# Patient Record
Sex: Male | Born: 1985 | Race: White | Hispanic: No | Marital: Single | Smoking: Never smoker
Health system: Southern US, Community
[De-identification: ages and names within clinical notes are randomized; demographics above are authoritative.]

---
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
## 2005-03-04 ENCOUNTER — Emergency Department (HOSPITAL_COMMUNITY): Admission: EM | Admit: 2005-03-04 | Discharge: 2005-03-04 | Payer: Self-pay | Admitting: Emergency Medicine

## 2005-04-29 ENCOUNTER — Emergency Department (HOSPITAL_COMMUNITY): Admission: EM | Admit: 2005-04-29 | Discharge: 2005-04-30 | Payer: Self-pay | Admitting: Emergency Medicine

## 2011-11-28 DIAGNOSIS — IMO0002 Reserved for concepts with insufficient information to code with codable children: Secondary | ICD-10-CM | POA: Diagnosis not present

## 2014-03-17 DIAGNOSIS — M109 Gout, unspecified: Secondary | ICD-10-CM | POA: Diagnosis not present

## 2014-06-06 DIAGNOSIS — Z6825 Body mass index (BMI) 25.0-25.9, adult: Secondary | ICD-10-CM | POA: Diagnosis not present

## 2014-06-06 DIAGNOSIS — H9193 Unspecified hearing loss, bilateral: Secondary | ICD-10-CM | POA: Diagnosis not present

## 2014-07-08 DIAGNOSIS — H903 Sensorineural hearing loss, bilateral: Secondary | ICD-10-CM | POA: Diagnosis not present

## 2014-07-08 DIAGNOSIS — H9313 Tinnitus, bilateral: Secondary | ICD-10-CM | POA: Diagnosis not present

## 2014-09-04 DIAGNOSIS — J329 Chronic sinusitis, unspecified: Secondary | ICD-10-CM | POA: Diagnosis not present

## 2014-09-04 DIAGNOSIS — Z6825 Body mass index (BMI) 25.0-25.9, adult: Secondary | ICD-10-CM | POA: Diagnosis not present

## 2016-02-27 DIAGNOSIS — N50819 Testicular pain, unspecified: Secondary | ICD-10-CM | POA: Diagnosis not present

## 2016-02-27 DIAGNOSIS — R1084 Generalized abdominal pain: Secondary | ICD-10-CM | POA: Diagnosis not present

## 2016-02-27 DIAGNOSIS — R109 Unspecified abdominal pain: Secondary | ICD-10-CM | POA: Diagnosis not present

## 2016-02-27 DIAGNOSIS — N50812 Left testicular pain: Secondary | ICD-10-CM | POA: Diagnosis not present

## 2016-02-27 DIAGNOSIS — R911 Solitary pulmonary nodule: Secondary | ICD-10-CM | POA: Diagnosis not present

## 2016-02-29 ENCOUNTER — Emergency Department (HOSPITAL_COMMUNITY): Payer: Medicare Other

## 2016-02-29 ENCOUNTER — Encounter (HOSPITAL_COMMUNITY): Payer: Self-pay

## 2016-02-29 ENCOUNTER — Emergency Department (HOSPITAL_COMMUNITY)
Admission: EM | Admit: 2016-02-29 | Discharge: 2016-02-29 | Disposition: A | Discharge status: Home / Self Care | Payer: Medicare Other | Attending: Emergency Medicine | Admitting: Emergency Medicine

## 2016-02-29 DIAGNOSIS — R1084 Generalized abdominal pain: Secondary | ICD-10-CM

## 2016-02-29 DIAGNOSIS — R1032 Left lower quadrant pain: Secondary | ICD-10-CM

## 2016-02-29 DIAGNOSIS — R1011 Right upper quadrant pain: Secondary | ICD-10-CM | POA: Diagnosis not present

## 2016-02-29 DIAGNOSIS — K760 Fatty (change of) liver, not elsewhere classified: Secondary | ICD-10-CM | POA: Insufficient documentation

## 2016-02-29 LAB — COMPREHENSIVE METABOLIC PANEL
ALT: 96 U/L — AB (ref 17–63)
AST: 46 U/L — ABNORMAL HIGH (ref 15–41)
Albumin: 4.5 g/dL (ref 3.5–5.0)
Alkaline Phosphatase: 55 U/L (ref 38–126)
Anion gap: 9 (ref 5–15)
BUN: 13 mg/dL (ref 6–20)
CHLORIDE: 104 mmol/L (ref 101–111)
CO2: 27 mmol/L (ref 22–32)
CREATININE: 1.04 mg/dL (ref 0.61–1.24)
Calcium: 9.8 mg/dL (ref 8.9–10.3)
GFR calc non Af Amer: >60 mL/min (ref >60)
Glucose, Bld: 93 mg/dL (ref 65–99)
Potassium: 4.2 mmol/L (ref 3.5–5.1)
SODIUM: 140 mmol/L (ref 135–145)
Total Bilirubin: 1 mg/dL (ref 0.3–1.2)
Total Protein: 7 g/dL (ref 6.5–8.1)

## 2016-02-29 LAB — CBC
HEMATOCRIT: 47.5 % (ref 39.0–52.0)
HEMOGLOBIN: 16.9 g/dL (ref 13.0–17.0)
MCH: 31.8 pg (ref 26.0–34.0)
MCHC: 35.6 g/dL (ref 30.0–36.0)
MCV: 89.5 fL (ref 78.0–100.0)
Platelets: 215 10*3/uL (ref 150–400)
RBC: 5.31 MIL/uL (ref 4.22–5.81)
RDW: 12.5 % (ref 11.5–15.5)
WBC: 6.6 10*3/uL (ref 4.0–10.5)

## 2016-02-29 LAB — URINALYSIS, ROUTINE W REFLEX MICROSCOPIC
Bilirubin Urine: NEGATIVE
GLUCOSE, UA: NEGATIVE mg/dL
HGB URINE DIPSTICK: NEGATIVE
Ketones, ur: NEGATIVE mg/dL
Leukocytes, UA: NEGATIVE
Nitrite: NEGATIVE
PH: 6 (ref 5.0–8.0)
Protein, ur: NEGATIVE mg/dL
SPECIFIC GRAVITY, URINE: 1.023 (ref 1.005–1.030)

## 2016-02-29 LAB — LIPASE, BLOOD: LIPASE: 19 U/L (ref 11–51)

## 2016-02-29 MED ORDER — IOPAMIDOL (ISOVUE-300) INJECTION 61%
INTRAVENOUS | Status: AC
  Administered 2016-02-29: 100 mL
  Filled 2016-02-29: qty 100

## 2016-02-29 NOTE — ED Provider Notes (Signed)
CSN: [id number]     Arrival date & time 02/29/16  4098 History   First MD Initiated Contact with Patient 02/29/16 1022     Chief Complaint  Patient presents with  . 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 Patient reports his generalized pain as a achy burning sensation. Patient states he has a pressure in his left lower quadrant with sharp pain when he moves. Patient states he has some heaviness in his testicles associated with his sharp pain comes. Patient had a negative testicular ultrasound at [place] on Saturday. Patient also has associated left-sided rib soreness. Patient has been having decreased amount of bowel movements. Patient normally has 2-3 bowel movements daily. Patient states he did not have any bowel movement on Saturday. Patient took a laxative and was able to have bowel movement. Patient was seen at [facility] on Saturday where he had a CT scan. He was told his appendix was on the left and that his "gut was twisted." Patient states that while he was there he had more periumbilical pain, but it has now moved more towards his left side. Patient was given Bentyl, Tylenol, Zofran but he has not taken any medications because he does not like taking medications. Patient reports nausea but no vomiting. He also denies any fevers, chest pain, shortness of breath, dysuria.  Patient is a 30 y.o. male presenting with abdominal pain. The history is provided by the patient and medical records.  Abdominal Pain Associated symptoms: constipation and nausea   Associated symptoms: no chest pain, no chills, no diarrhea, no dysuria, no fever, no shortness of breath, no sore throat and no vomiting     History reviewed. No  pertinent past medical history. History reviewed. No pertinent past surgical history. No family history on file. Social History  Substance Use Topics  . Smoking status: Never Smoker   . Smokeless tobacco: None  . Alcohol Use: None    Review of Systems  Constitutional: Negative for fever and chills.  HENT: Negative for facial swelling and sore throat.   Respiratory: Negative for shortness of breath.   Cardiovascular: Negative for chest pain.  Gastrointestinal: Positive for nausea, abdominal pain and constipation. Negative for vomiting and diarrhea.  Genitourinary: Negative for dysuria.  Musculoskeletal: Negative for back pain.  Skin: Negative for rash and wound.  Neurological: Negative for headaches.  Psychiatric/Behavioral: The patient is not nervous/anxious.       Allergies  Review of patient's allergies indicates no known allergies.  Home Medications   Prior to Admission medications   Not on File   BP 127/100 mmHg  Pulse 78  Temp(Src) 97.9 F (36.6 C) (Oral)  Resp 16  SpO2 100% Physical Exam  Constitutional: He appears well-developed and well-nourished. No distress.  HENT:  Head: Normocephalic and atraumatic.  Mouth/Throat: Oropharynx is clear and moist. No oropharyngeal exudate.  Eyes: Conjunctivae are normal. Pupils are equal, round, and reactive to light. Right eye exhibits no discharge. Left eye exhibits no discharge. No scleral icterus.  Neck: Normal range of motion. Neck supple. No thyromegaly present.  Cardiovascular: Normal rate, regular rhythm, normal heart sounds and intact distal pulses.  Exam reveals no gallop and no friction rub.   No murmur heard. Pulmonary/Chest: Effort normal and breath sounds normal. No stridor. No respiratory  distress. He has no wheezes. He has no rales.  Abdominal: Soft. Bowel sounds are normal. He exhibits no distension, no abdominal bruit, no pulsatile midline mass and no mass. There is tenderness in the right upper quadrant and  left lower quadrant. There is positive Murphy's sign. There is no rigidity, no rebound and no guarding.  Musculoskeletal: He exhibits no edema.  Lymphadenopathy:    He has no cervical adenopathy.  Neurological: He is alert. Coordination normal.  Skin: Skin is warm and dry. No rash noted. He is not diaphoretic. No pallor.  Psychiatric: He has a normal mood and affect.  Nursing note and vitals reviewed.   ED Course  Procedures (including critical care time) Labs Review Labs Reviewed  COMPREHENSIVE METABOLIC PANEL - Abnormal; Notable for the following:    AST 46 (*)    ALT 96 (*)    All other components within normal limits  LIPASE, BLOOD  CBC  URINALYSIS, ROUTINE W REFLEX MICROSCOPIC (NOT AT [facility])    Imaging Review Ct Abdomen Pelvis W Contrast  02/29/2016  CLINICAL DATA:  Generalized abdominal pain and left flank pain. EXAM: CT ABDOMEN AND PELVIS WITH CONTRAST TECHNIQUE: Multidetector CT imaging of the abdomen and pelvis was performed using the standard protocol following bolus administration of intravenous contrast. CONTRAST:  1 ISOVUE-300 IOPAMIDOL (ISOVUE-300) INJECTION 61% COMPARISON:  CT scan of the abdomen dated 02/25/2016 [facility] FINDINGS: Lower chest: 4 mm nodule in the right lower lobe seen on image 9 of series 3, unchanged since the prior exam. The heart size is normal. Hepatobiliary: Diffuse hepatic steatosis. No focal lesions. Biliary tree is normal. Pancreas: Normal. Spleen: Normal. Adrenals/Urinary Tract: Normal. Stomach/Bowel: No evidence of obstruction. The cecum lies in the left upper quadrant and the appendix is in the left mid abdomen. There is intense enhancement of the mucosa of the terminal ileum there is no adjacent inflammation. The bowel otherwise appears normal. Vascular/Lymphatic: Normal. Reproductive: Normal. Other: No free air or free fluid. Musculoskeletal:  Normal. IMPRESSION: 1. 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 The appendix is in the left side of the abdomen. 2. Intense enhancement of the mucosa of the terminal ileum without adjacent inflammation. I suspect this is normal. Does the patient have any symptoms of inflammatory bowel disease? 3. Stable 4 mm nodule at the right lung base. No follow-up is recommended if the patient is at low risk for lung cancer. 4. Diffuse hepatic steatosis. Electronically Signed   By: Francene BoyersJames  Maxwell M.D.   On: 02/29/2016 12:21   [place] Abdomen Limited Ruq  02/29/2016  CLINICAL DATA:  Right upper quadrant abdominal pain. EXAM: US ABDOMEN LIMITED - RIGHT UPPER QUADRANT COMPARISON:  Abdomen CT obtained earlier today. FINDINGS: Gallbladder: No gallstones or wall thickening visualized. No sonographic Murphy sign noted by sonographer. Common bile duct: Diameter: 3.2 mm Liver: Diffusely echogenic.  Corresponding low density on the recent CT. IMPRESSION: 1. No acute abnormality. 2. Diffuse hepatic steatosis. Electronically Signed   By: Beckie SaltsSteven  Reid M.D.   On: 02/29/2016 13:44   I have personally reviewed and evaluated these images and lab results as part of my medical decision-making.   EKG Interpretation None      MDM   CBC unremarkable. CMP shows AST 46, ALT 96. Lipase 19. UA negative. 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 I'll discharge patient home with advice to take the Bentyl, Zofran, Tylenol that was prescribed to him by [facility] with follow-up to gastroenterology. Strict return precautions discussed. Patient also evaluated by Dr. Madilyn Hook who is in agreement with plan. Patient vitals  stable throughout ED course and discharged in satisfactory condition.  Final diagnoses:  RUQ pain  Generalized abdominal pain  LLQ pain  Fatty liver        Emi Holes, PA-C 02/29/16 1534  Tilden Fossa, MD 03/02/16 [phone]

## 2016-02-29 NOTE — Discharge Instructions (Signed)
Your CT scan and ultrasound showed that you have a fatty liver. This can be genetic. Take Bentyl, Zofran, Tylenol as prescribed for your pain and nausea. Try to stick with bland foods at this time, avoid alcohol, and drink plenty of water. Please follow-up with [facility] for further evaluation and treatment of your symptoms. Please return to the emergency department if you develop any new or worsening symptoms.   Abdominal Pain, Adult Many things can cause abdominal pain. Usually, abdominal pain is not caused by a disease and will improve without treatment. It can often be observed and treated at home. Your health care provider will do a physical exam and possibly order blood tests and X-rays to help determine the seriousness of your pain. However, in many cases, more time must pass before a clear cause of the pain can be found. Before that point, your health care provider may not know if you need more testing or further treatment. HOME CARE INSTRUCTIONS Monitor your abdominal pain for any changes. The following actions may help to alleviate any discomfort you are experiencing:  Only take over-the-counter or prescription medicines as directed by your health care provider.  Do not take laxatives unless directed to do so by your health care provider.  Try a clear liquid diet (broth, tea, or water) as directed by your health care provider. Slowly move to a bland diet as tolerated. SEEK MEDICAL CARE IF:  You have unexplained abdominal pain.  You have abdominal pain associated with nausea or diarrhea.  You have pain when you urinate or have a bowel movement.  You experience abdominal pain that wakes you in the night.  You have abdominal pain that is worsened or improved by eating food.  You have abdominal pain that is worsened with eating fatty foods.  You have a fever. SEEK IMMEDIATE MEDICAL CARE IF:  Your pain does not go away within 2 hours.  You keep throwing up  (vomiting).  Your pain is felt only in portions of the abdomen, such as the right side or the left lower portion of the abdomen.  You pass bloody or black tarry stools. MAKE SURE YOU:  Understand these instructions.  Will watch your condition.  Will get help right away if you are not doing well or get worse.   This information is not intended to replace advice given to you by your health care provider. Make sure you discuss any questions you have with your health care provider.   Document Released: 05/18/2005 Document Revised: 04/29/2015 Document Reviewed: 04/17/2013 Elsevier Interactive Patient Education 2016 Else[employer] Inc.  Fatty Liver Fatty liver, also called hepatic steatosis or steatohepatitis, is a condition in which too much fat has built up in your liver cells. The liver removes harmful substances from your bloodstream. It produces fluids your body needs. It also helps your body use and store energy from the food you eat. In many cases, fatty liver does not cause symptoms or problems. It is often diagnosed when tests are being done for other reasons. However, over time, fatty liver can cause inflammation that may lead to more serious liver problems, such as scarring of the liver (cirrhosis). CAUSES  Causes of fatty liver may include:   Drinking too much alcohol.  Poor nutrition.  Obesity.  Cushing syndrome.  Diabetes.  Hyperlipidemia.  Pregnancy.  Certain drugs.  Poisons.  Some viral infections. RISK FACTORS You may be more likely to develop fatty liver if you:  Abuse alcohol.  Are pregnant.  Are overweight.  Have diabetes.  Have hepatitis.  Have a high triglyceride level.  SIGNS AND SYMPTOMS  Fatty liver often does not cause any symptoms. In cases where symptoms develop, they can include:  Fatigue.  Weakness.  Weight loss.  Confusion.   Abdominal pain.  Yellowing of your skin and the white parts of your eyes (jaundice).  Nausea and  vomiting. DIAGNOSIS  Fatty liver may be diagnosed by:   Physical exam and medical history.  Blood tests.  Imaging tests, such as an ultrasound, CT scan, or MRI.  Liver biopsy. A small sample of liver tissue is removed using a needle. The sample is then looked at under a microscope. TREATMENT  Fatty liver is often caused by other health conditions. Treatment for fatty liver may involve medicines and lifestyle changes to manage conditions such as:   Alcoholism.  High cholesterol.  Diabetes.  Being overweight or obese.  HOME CARE INSTRUCTIONS  Eat a healthy diet as directed by your health care provider.  Exercise regularly. This can help you lose weight and control your cholesterol and diabetes. Talk to your health care provider about an exercise plan and which activities are best for you.  Do not drink alcohol.   Take medicines only as directed by your health care provider. SEEK MEDICAL CARE IF: You have difficulty controlling your:  Blood sugar.  Cholesterol.  Alcohol consumption. SEEK IMMEDIATE MEDICAL CARE IF:  You have abdominal pain.  You have jaundice.  You have nausea and vomiting.   This information is not intended to replace advice given to you by your health care provider. Make sure you discuss any questions you have with your health care provider.   Document Released: 09/23/2005 Document Revised: 08/29/2014 Document Reviewed: 12/18/2013 Elsevier Interactive Patient Education [employer]

## 2016-02-29 NOTE — ED Notes (Signed)
Patient here with generalized abdominal pain with radiation to left flank. Seen at [place] and had multiple test and MRI and no diagnosis. NAD

## 2016-03-02 DIAGNOSIS — N5089 Other specified disorders of the male genital organs: Secondary | ICD-10-CM | POA: Diagnosis present

## 2016-03-02 DIAGNOSIS — R740 Nonspecific elevation of levels of transaminase and lactic acid dehydrogenase [LDH]: Secondary | ICD-10-CM | POA: Insufficient documentation

## 2016-03-02 DIAGNOSIS — R109 Unspecified abdominal pain: Secondary | ICD-10-CM | POA: Diagnosis not present

## 2016-03-02 DIAGNOSIS — N50812 Left testicular pain: Secondary | ICD-10-CM | POA: Diagnosis not present

## 2016-03-02 DIAGNOSIS — I861 Scrotal varices: Secondary | ICD-10-CM | POA: Insufficient documentation

## 2016-03-02 LAB — URINALYSIS, ROUTINE W REFLEX MICROSCOPIC
BILIRUBIN URINE: NEGATIVE
Glucose, UA: NEGATIVE mg/dL
Hgb urine dipstick: NEGATIVE
KETONES UR: NEGATIVE mg/dL
Leukocytes, UA: NEGATIVE
NITRITE: NEGATIVE
PH: 6.5 (ref 5.0–8.0)
PROTEIN: NEGATIVE mg/dL
Specific Gravity, Urine: 1.025 (ref 1.005–1.030)

## 2016-03-02 LAB — COMPREHENSIVE METABOLIC PANEL
ALBUMIN: 4.6 g/dL (ref 3.5–5.0)
ALK PHOS: 56 U/L (ref 38–126)
ALT: 88 U/L — ABNORMAL HIGH (ref 17–63)
AST: 39 U/L (ref 15–41)
Anion gap: 8 (ref 5–15)
BILIRUBIN TOTAL: 0.6 mg/dL (ref 0.3–1.2)
BUN: 10 mg/dL (ref 6–20)
CALCIUM: 9.9 mg/dL (ref 8.9–10.3)
CO2: 28 mmol/L (ref 22–32)
Chloride: 101 mmol/L (ref 101–111)
Creatinine, Ser: 1.08 mg/dL (ref 0.61–1.24)
GFR calc Af Amer: >60 mL/min (ref >60)
GFR calc non Af Amer: >60 mL/min (ref >60)
GLUCOSE: 100 mg/dL — AB (ref 65–99)
POTASSIUM: 4.2 mmol/L (ref 3.5–5.1)
SODIUM: 137 mmol/L (ref 135–145)
TOTAL PROTEIN: 7.1 g/dL (ref 6.5–8.1)

## 2016-03-02 LAB — CBC
HEMATOCRIT: 47.8 % (ref 39.0–52.0)
Hemoglobin: 17 g/dL (ref 13.0–17.0)
MCH: 31.7 pg (ref 26.0–34.0)
MCHC: 35.6 g/dL (ref 30.0–36.0)
MCV: 89 fL (ref 78.0–100.0)
Platelets: 234 10*3/uL (ref 150–400)
RBC: 5.37 MIL/uL (ref 4.22–5.81)
RDW: 12.1 % (ref 11.5–15.5)
WBC: 9.5 10*3/uL (ref 4.0–10.5)

## 2016-03-02 LAB — LIPASE, BLOOD: Lipase: 17 U/L (ref 11–51)

## 2016-03-02 NOTE — ED Notes (Signed)
Patient arrives with complaint of scrotal swelling. States onset of swelling was last night and today. States onset of abdominal pain was about 5 days ago. Has been to [facility] and here once for the abdominal pain with no extraordinary findings. At time of other presentations patient had testicle pain, but states it wasn't evaluated.

## 2016-03-03 ENCOUNTER — Emergency Department (HOSPITAL_COMMUNITY)
Admission: EM | Admit: 2016-03-03 | Discharge: 2016-03-03 | Disposition: A | Discharge status: Home / Self Care | Payer: Medicare Other | Attending: Emergency Medicine | Admitting: Emergency Medicine

## 2016-03-03 ENCOUNTER — Emergency Department (HOSPITAL_COMMUNITY): Payer: Medicare Other

## 2016-03-03 DIAGNOSIS — R7401 Elevation of levels of liver transaminase levels: Secondary | ICD-10-CM

## 2016-03-03 DIAGNOSIS — N50819 Testicular pain, unspecified: Secondary | ICD-10-CM

## 2016-03-03 DIAGNOSIS — N5089 Other specified disorders of the male genital organs: Secondary | ICD-10-CM

## 2016-03-03 DIAGNOSIS — R109 Unspecified abdominal pain: Secondary | ICD-10-CM

## 2016-03-03 DIAGNOSIS — I861 Scrotal varices: Secondary | ICD-10-CM

## 2016-03-03 DIAGNOSIS — R740 Nonspecific elevation of levels of transaminase and lactic acid dehydrogenase [LDH]: Secondary | ICD-10-CM

## 2016-03-03 MED ORDER — OXYCODONE-ACETAMINOPHEN 5-325 MG PO TABS
1.0000 | ORAL_TABLET | Freq: Once | ORAL | Status: AC
  Administered 2016-03-03: 1 via ORAL
  Filled 2016-03-03: qty 1

## 2016-03-03 MED ORDER — OXYCODONE-ACETAMINOPHEN 5-325 MG PO TABS: 1.0000 | ORAL_TABLET | ORAL | Status: AC | PRN

## 2016-03-03 NOTE — Discharge Instructions (Signed)
Take acetaminophen or ibuprofen as needed for less severe pain.  Your evaluation today did not show a clear cause for your pain, but did not show any evidence of any serious problems. Follow up with the gastroenterologist and urologist. Return if pain is getting worse, you start running a fever, or start vomiting.   Abdominal Pain, Adult Many things can cause abdominal pain. Usually, abdominal pain is not caused by a disease and will improve without treatment. It can often be observed and treated at home. Your health care provider will do a physical exam and possibly order blood tests and X-rays to help determine the seriousness of your pain. However, in many cases, more time must pass before a clear cause of the pain can be found. Before that point, your health care provider may not know if you need more testing or further treatment. HOME CARE INSTRUCTIONS Monitor your abdominal pain for any changes. The following actions may help to alleviate any discomfort you are experiencing:  Only take over-the-counter or prescription medicines as directed by your health care provider.  Do not take laxatives unless directed to do so by your health care provider.  Try a clear liquid diet (broth, tea, or water) as directed by your health care provider. Slowly move to a bland diet as tolerated. SEEK MEDICAL CARE IF:  You have unexplained abdominal pain.  You have abdominal pain associated with nausea or diarrhea.  You have pain when you urinate or have a bowel movement.  You experience abdominal pain that wakes you in the night.  You have abdominal pain that is worsened or improved by eating food.  You have abdominal pain that is worsened with eating fatty foods.  You have a fever. SEEK IMMEDIATE MEDICAL CARE IF:  Your pain does not go away within 2 hours.  You keep throwing up (vomiting).  Your pain is felt only in portions of the abdomen, such as the right side or the left lower portion of  the abdomen.  You pass bloody or black tarry stools. MAKE SURE YOU:  Understand these instructions.  Will watch your condition.  Will get help right away if you are not doing well or get worse.   This information is not intended to replace advice given to you by your health care provider. Make sure you discuss any questions you have with your health care provider.   Document Released: 05/18/2005 Document Revised: 04/29/2015 Document Reviewed: 04/17/2013 Elsevier Interactive Patient Education 2016 [employer].  Varicocele A varicocele is a swelling of veins in the scrotum. The scrotum is the sac that contains the testicles. Varicoceles can occur on either side of the scrotum, but they are more common on the left side. They occur most often in teenage boys and young men. In most cases, varicoceles are not a serious problem. They are usually small and painless and do not require treatment. Tests may be done to confirm the diagnosis. Treatment may be needed if:  A varicocele is large, causes a lot of pain, or causes pain when exercising.  Varicoceles are found on both sides of the scrotum.  The testicle on the opposite side is absent or not normal.  A varicocele causes a decrease in the size of the testicle in a growing adolescent.  The person has fertility problems. CAUSES This condition is the result of valves in the veins not working properly. Valves in the veins help to return blood from the scrotum and testicles to the heart. If these  valves do not work well, blood flows backward and backs up into the veins, which causes the veins to swell. This is similar to what happens when varicose veins form in the leg. SYMPTOMS Most varicoceles do not cause any symptoms. If symptoms do occur, they may include:  Swelling on one side of the scrotum. The swelling may be more obvious when you are standing up.  A lumpy feeling in the scrotum.  A heavy feeling on one side of the scrotum.  A  dull ache in the scrotum, especially after exercise or prolonged standing or sitting.  Slower growth or reduced size of the testicle on the side of the varicocele (in young males).  Problems with fertility. These can occur if the testicle does not grow normally. DIAGNOSIS This condition may be diagnosed with a physical exam. You may also have an imaging test, called an ultrasound, to confirm the diagnosis and to help rule out other causes of the swelling. TREATMENT Treatment is usually not needed for this condition. If you have any pain, your health care provider may prescribe or recommend medicine to help relieve it. You may need regular exams so your health care provider can monitor the varicocele to ensure that it does not cause problems. When further treatment is needed, it may involve one of these options:  Varicocelectomy. This is a surgery in which the swollen veins are tied off so that the flow of blood goes to other veins instead.  Embolization. In this procedure, a small tube (catheter) is used to place metal coils or other blocking items in the veins. This cuts off the blood flow to the swollen veins. HOME CARE INSTRUCTIONS  Take medicines only as directed by your health care provider.  Wear supportive underwear.  Use an athletic supporter for sports.  Keep all follow-up visits as directed by your health care provider. This is important. SEEK MEDICAL CARE IF:  Your pain is increasing.  You have redness in the affected area.  You have swelling that does not decrease when you are lying down.  One of your testicles is smaller than the other.  Your testicle becomes enlarged, swollen, or painful.   This information is not intended to replace advice given to you by your health care provider. Make sure you discuss any questions you have with your health care provider.   Document Released: 11/14/2000 Document Revised: 12/23/2014 Document Reviewed: 07/16/2014 Elsevier  Interactive Patient Education [employer]2016 Elsevier Inc.

## 2016-03-03 NOTE — ED Provider Notes (Signed)
CSN: [id number]     Arrival date & time 03/02/16  2040 History  By signing my name below, I, Jasmyn B. Alexander, attest that this documentation has been prepared under the direction and in the presence of Dione Booze, MD.  Electronically Signed: Gillis Ends. Lyn Hollingshead, ED Scribe. 03/03/2016. 1:57 AM.   Chief Complaint  Patient presents with  . Groin Swelling  . Abdominal Pain    The history is provided by the patient. No language interpreter was used.   HPI Comments: Keith Garcia is a 30 y.o. male who presents to the Emergency Department complaining of gradual onset, constant, worsening, 7/10 lower abdominal pain x 5 days and scrotal swelling x 2 days. Pt has associated nausea and back pain. Pt presented in [facility] on 02/29/16 for same complaint with no significant findings on imaging results. He states that he was instructed to return to [facility] if pain worsened. Pt states pain is relieved when lying supine. Denies any vomiting, fever, dysuria, difficulty urinating.   No past medical history on file. No past surgical history on file. No family history on file. Social History  Substance Use Topics  . Smoking status: Never Smoker   . Smokeless tobacco: Not on file  . Alcohol Use: Not on file    Review of Systems  Constitutional: Negative for fever.  Gastrointestinal: Positive for nausea and abdominal pain. Negative for vomiting.  Genitourinary: Positive for scrotal swelling. Negative for dysuria and difficulty urinating.  Musculoskeletal: Positive for back pain.   Allergies  Review of patient's allergies indicates no known allergies.  Home Medications   Prior to Admission medications   Not on File   BP 118/80 mmHg  Pulse 58  Temp(Src) 98.3 F (36.8 C) (Oral)  Resp 16  Ht 5\' 11"  (1.803 m)  Wt 204 lb (92.534 kg)  BMI 28.46 kg/m2  SpO2 99% Physical Exam  Constitutional: He is oriented to person, place, and time. He appears well-developed and well-nourished.  HENT:  Head:  Normocephalic and atraumatic.  Eyes: Conjunctivae and EOM are normal. Pupils are equal, round, and reactive to light.  Neck: Normal range of motion. Neck supple. No JVD present.  Cardiovascular: Normal rate and regular rhythm.   No murmur heard. Pulmonary/Chest: Effort normal and breath sounds normal. He has no wheezes. He has no rales. He exhibits no tenderness.  Abdominal: Soft. Bowel sounds are normal. He exhibits no distension. There is tenderness. There is no rebound and no guarding. Hernia confirmed negative in the right inguinal area and confirmed negative in the left inguinal area.  Mild suprapubic tenderness   Genitourinary: Right testis shows no mass. Left testis shows no mass. Circumcised.  Testes descended without masses No hernias No definite scrotal mass  Musculoskeletal: Normal range of motion. He exhibits no edema.  Lymphadenopathy:    He has no cervical adenopathy.  Neurological: He is alert and oriented to person, place, and time. No cranial nerve deficit. He exhibits normal muscle tone. Coordination normal.  Skin: Skin is warm and dry. No rash noted.  Psychiatric: He has a normal mood and affect. His behavior is normal. Judgment and thought content normal.  Nursing note and vitals reviewed.  ED Course  Procedures (including critical care time) DIAGNOSTIC STUDIES: Oxygen Saturation is 99% on RA, normal by my interpretation.    COORDINATION OF CARE: 1:54 AM-Discussed treatment plan which includes [place] of Scrotum and Art/Ven Flow of Abd/Pel Doppler with pt at bedside and pt agreed to plan.   Labs Review  Results for orders placed or performed during the hospital encounter of 03/03/16  Lipase, blood  Result Value Ref Range   Lipase 17 11 - 51 U/L  Comprehensive metabolic panel  Result Value Ref Range   Sodium 137 135 - 145 mmol/L   Potassium 4.2 3.5 - 5.1 mmol/L   Chloride 101 101 - 111 mmol/L   CO2 28 22 - 32 mmol/L   Glucose, Bld 100 (H) 65 - 99 mg/dL   BUN 10 6  - 20 mg/dL   Creatinine, Ser 4.09 0.61 - 1.24 mg/dL   Calcium 9.9 8.9 - 81.1 mg/dL   Total Protein 7.1 6.5 - 8.1 g/dL   Albumin 4.6 3.5 - 5.0 g/dL   AST 39 15 - 41 U/L   ALT 88 (H) 17 - 63 U/L   Alkaline Phosphatase 56 38 - 126 U/L   Total Bilirubin 0.6 0.3 - 1.2 mg/dL   GFR calc non Af Amer >60 >60 mL/min   GFR calc Af Amer >60 >60 mL/min   Anion gap 8 5 - 15  CBC  Result Value Ref Range   WBC 9.5 4.0 - 10.5 K/uL   RBC 5.37 4.22 - 5.81 MIL/uL   Hemoglobin 17.0 13.0 - 17.0 g/dL   HCT 91.4 78.2 - 95.6 %   MCV 89.0 78.0 - 100.0 fL   MCH 31.7 26.0 - 34.0 pg   MCHC 35.6 30.0 - 36.0 g/dL   RDW 21.3 08.6 - 57.8 %   Platelets 234 150 - 400 K/uL  Urinalysis, Routine w reflex microscopic  Result Value Ref Range   Color, Urine YELLOW YELLOW   APPearance CLEAR CLEAR   Specific Gravity, Urine 1.025 1.005 - 1.030   pH 6.5 5.0 - 8.0   Glucose, UA NEGATIVE NEGATIVE mg/dL   Hgb urine dipstick NEGATIVE NEGATIVE   Bilirubin Urine NEGATIVE NEGATIVE   Ketones, ur NEGATIVE NEGATIVE mg/dL   Protein, ur NEGATIVE NEGATIVE mg/dL   Nitrite NEGATIVE NEGATIVE   Leukocytes, UA NEGATIVE NEGATIVE   Imaging Review US Scrotum  03/03/2016  CLINICAL DATA:  30 year old male with testicular pain and swelling. EXAM: SCROTAL ULTRASOUND DOPPLER ULTRASOUND OF THE TESTICLES TECHNIQUE: Complete ultrasound examination of the testicles, epididymis, and other scrotal structures was performed. Color and spectral Doppler ultrasound were also utilized to evaluate blood flow to the testicles. COMPARISON:  None. FINDINGS: Right testicle Measurements: 4.6 x 2.6 x 3.3 cm. No mass or microlithiasis visualized. Left testicle Measurements: 4.5 x 2.5 x 3.1 cm. No mass or microlithiasis visualized. Right epididymis:  Normal in size and appearance. Left epididymis: Normal in size and appearance. There is a 3 mm left epididymal head cyst. Hydrocele:  None visualized. Varicocele:  Left-sided varicoceles noted. Pulsed Doppler  interrogation of both testes demonstrates normal low resistance arterial and venous waveforms bilaterally. There is mild edema of the scrotal sac. 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 Electronically Signed   By: Elgie Collard M.D.   On: 03/03/2016 02:28   [place] Art/ven Flow Abd Pelv Doppler  03/03/2016  CLINICAL DATA:  30 year old male with testicular pain and swelling. EXAM: SCROTAL ULTRASOUND DOPPLER ULTRASOUND OF THE TESTICLES TECHNIQUE: Complete ultrasound examination of the testicles, epididymis, and other scrotal structures was performed. Color and spectral Doppler ultrasound were also utilized to evaluate blood flow to the testicles. COMPARISON:  None. FINDINGS: Right testicle Measurements: 4.6 x 2.6 x 3.3 cm. No mass or microlithiasis visualized. Left testicle Measurements: 4.5 x 2.5 x 3.1 cm. No mass or  microlithiasis visualized. Right epididymis:  Normal in size and appearance. Left epididymis: Normal in size and appearance. There is a 3 mm left epididymal head cyst. Hydrocele:  None visualized. Varicocele:  Left-sided varicoceles noted. Pulsed Doppler interrogation of both testes demonstrates normal low resistance arterial and venous waveforms bilaterally. There is mild edema of the scrotal sac. 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 Electronically Signed   By: Elgie CollardArash  Radparvar M.D.   On: 03/03/2016 02:28   I have personally reviewed and evaluated these images and lab results as part of my medical decision-making.  MDM   Final diagnoses:  Testicular pain  Swelling of the testicles    Persistent abdominal pain with groin swelling. Exam shows no significant swelling but will obtain scrotal ultrasound. Old records are reviewed and he was seen in ED 2 days ago with extensive workup including CT of abdomen and pelvis and abdominal ultrasound-all which were essentially negative. No concerning findings on exam.  Labs are reassuring. Only abnormality seen is mild elevation of ALT which is not significantly changed.    I personally performed the services described in this documentation, which was scribed in my presence. The recorded information has been reviewed and is accurate.       Dione Boozeavid Darionna Banke, MD 03/03/16 [phone]

## 2016-03-07 DIAGNOSIS — N419 Inflammatory disease of prostate, unspecified: Secondary | ICD-10-CM | POA: Diagnosis not present

## 2016-03-07 DIAGNOSIS — Z1389 Encounter for screening for other disorder: Secondary | ICD-10-CM | POA: Diagnosis not present

## 2016-03-07 DIAGNOSIS — K4090 Unilateral inguinal hernia, without obstruction or gangrene, not specified as recurrent: Secondary | ICD-10-CM | POA: Diagnosis not present

## 2016-03-07 DIAGNOSIS — N50819 Testicular pain, unspecified: Secondary | ICD-10-CM | POA: Diagnosis not present

## 2016-04-26 DIAGNOSIS — R21 Rash and other nonspecific skin eruption: Secondary | ICD-10-CM | POA: Diagnosis not present

## 2016-04-26 DIAGNOSIS — Z6829 Body mass index (BMI) 29.0-29.9, adult: Secondary | ICD-10-CM | POA: Diagnosis not present

## 2016-04-26 DIAGNOSIS — W57XXXA Bitten or stung by nonvenomous insect and other nonvenomous arthropods, initial encounter: Secondary | ICD-10-CM | POA: Diagnosis not present

## 2016-10-02 IMAGING — US US ABDOMEN LIMITED
1 series · 14 of 25 positions shown · non-contrast
Comparison: Abdomen CT obtained earlier today.

CLINICAL DATA: Right upper quadrant abdominal pain.

EXAM:
US ABDOMEN LIMITED - RIGHT UPPER QUADRANT

[Series 1: us abdomen limited · 0.23mm/px · 14 of 34 slices shown]
[im 1/34]
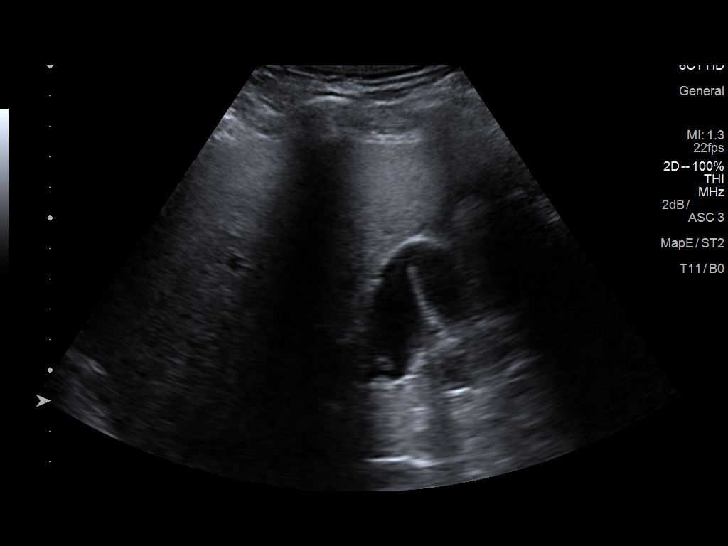
[im 3/34]
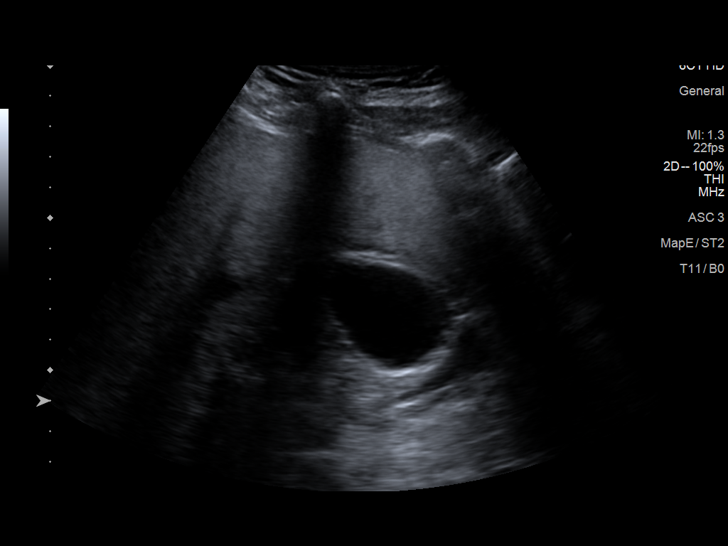
[im 6/34]
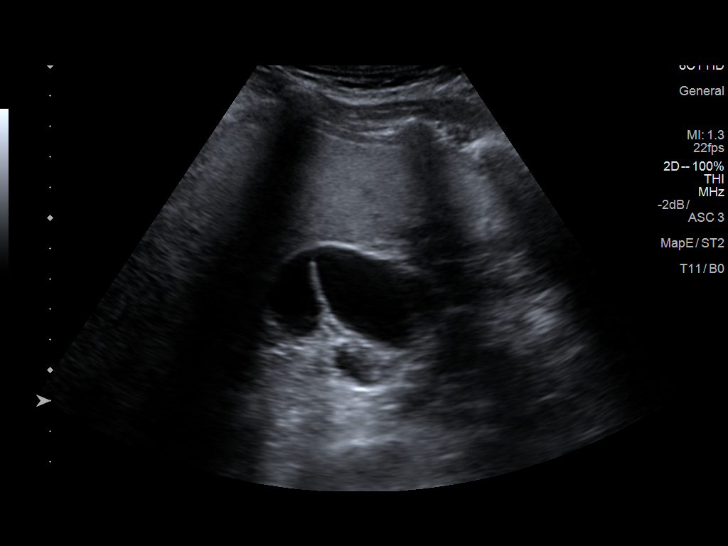
[im 9/34]
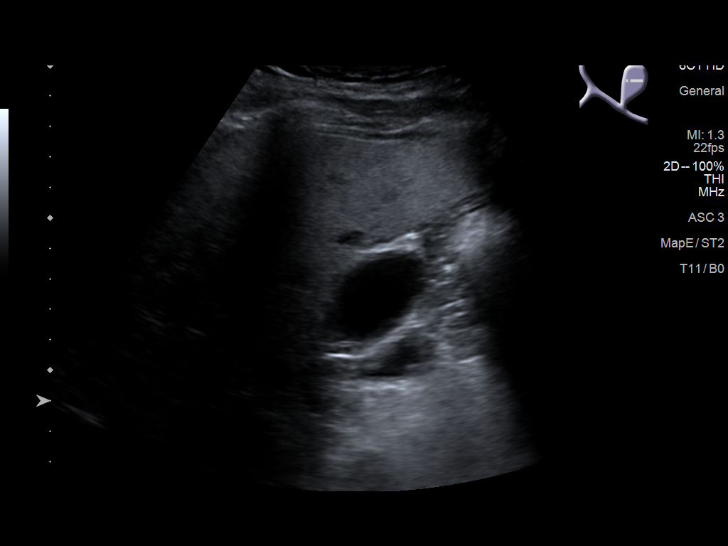
[im 12/34]
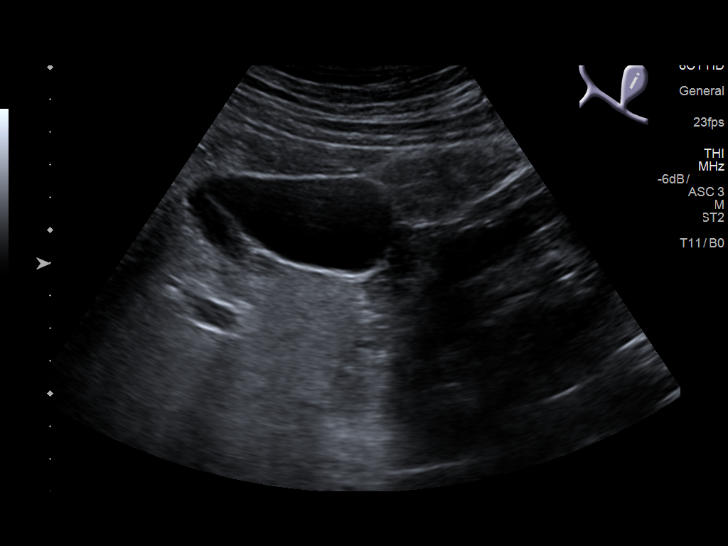
[im 13/34]
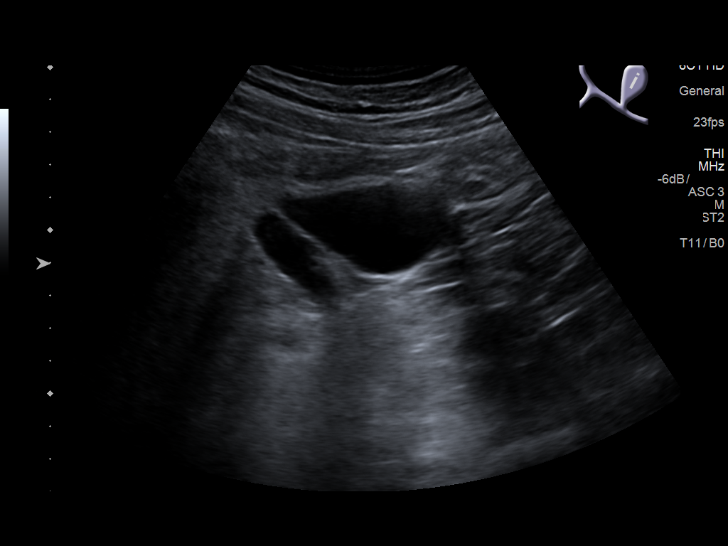
[im 16/34]
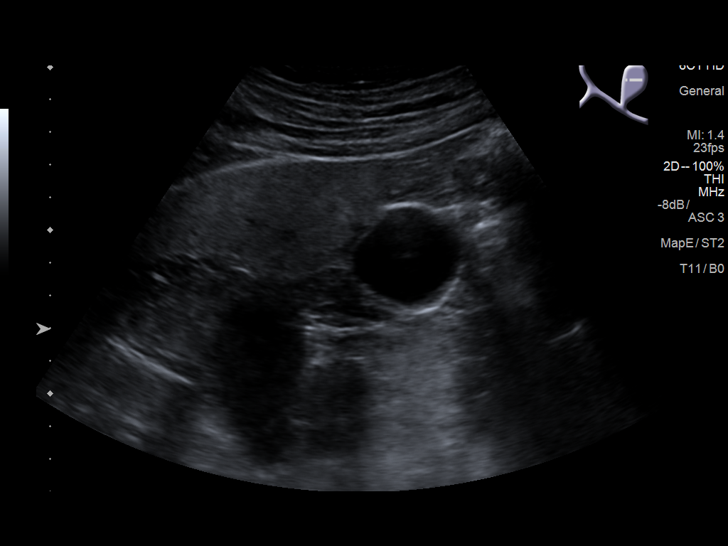
[im 18/34]
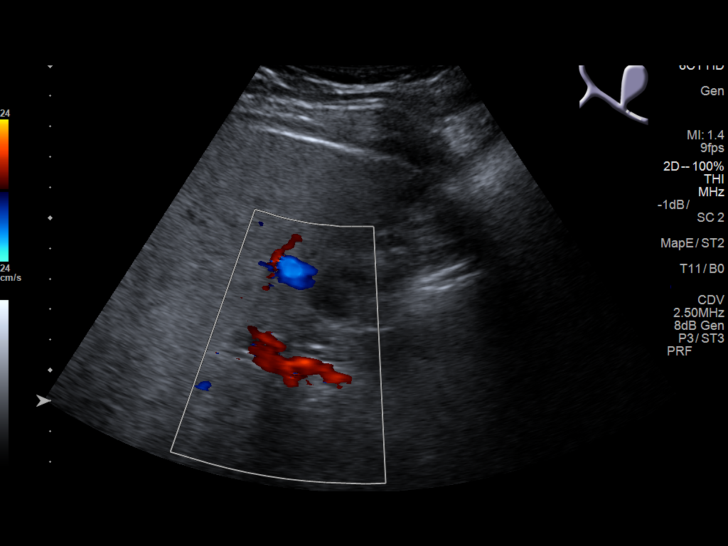
[im 21/34]
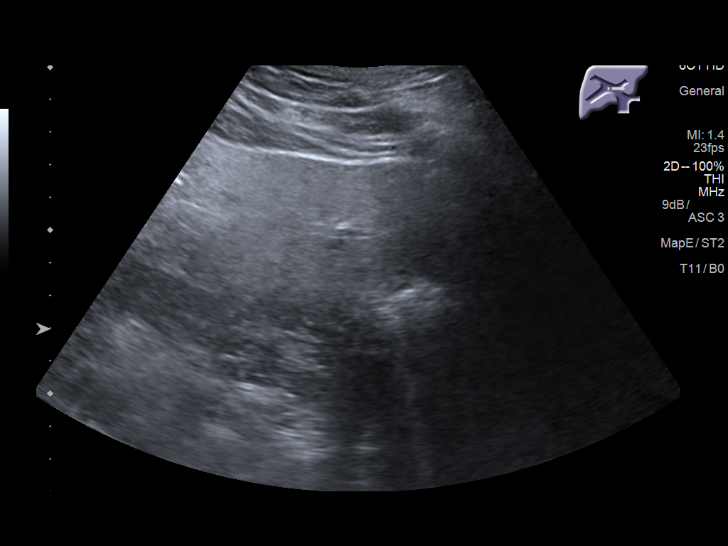
[im 23/34]
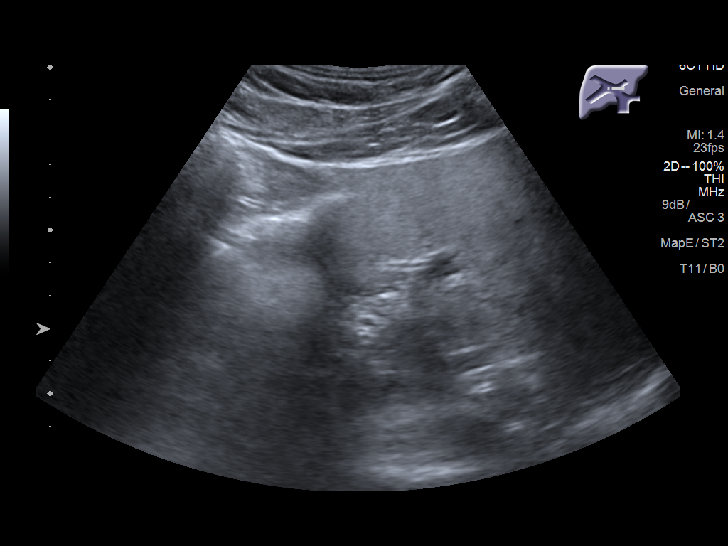
[im 25/34]
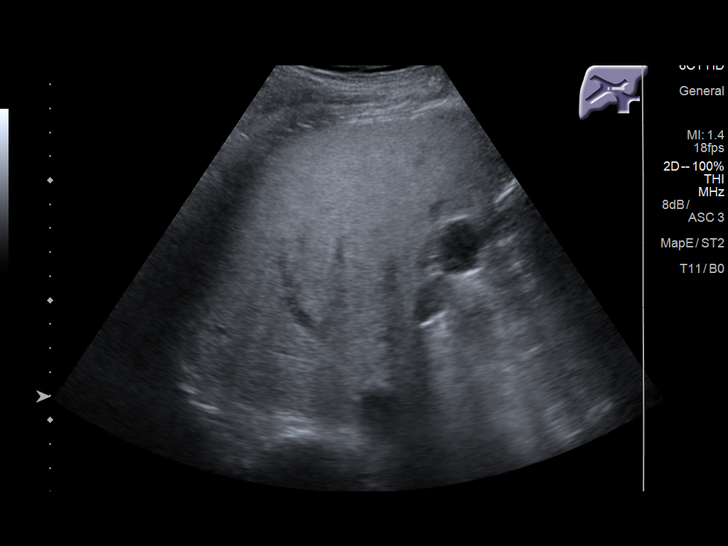
[im 28/34]
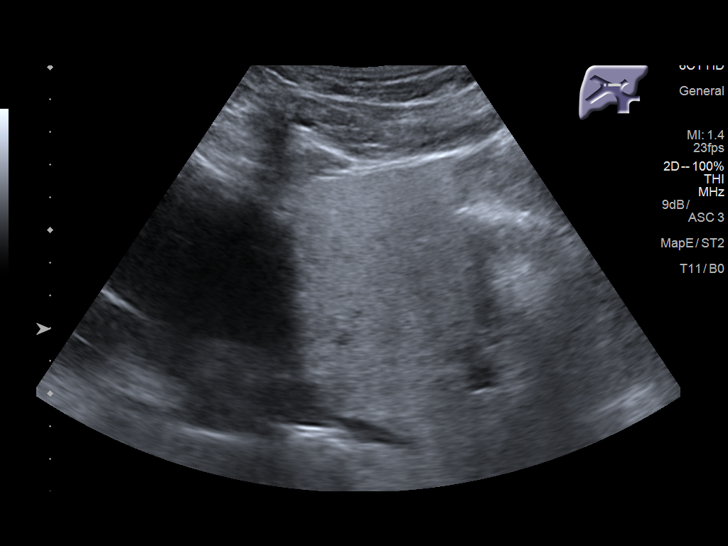
[im 31/34]
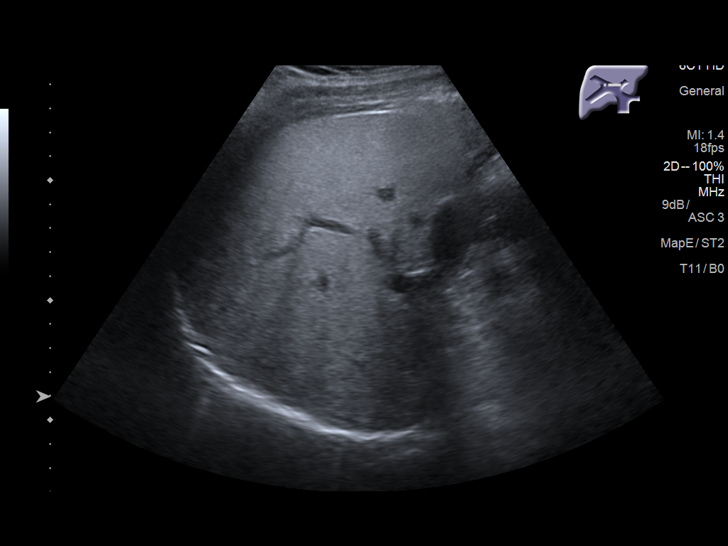
[im 34/34]
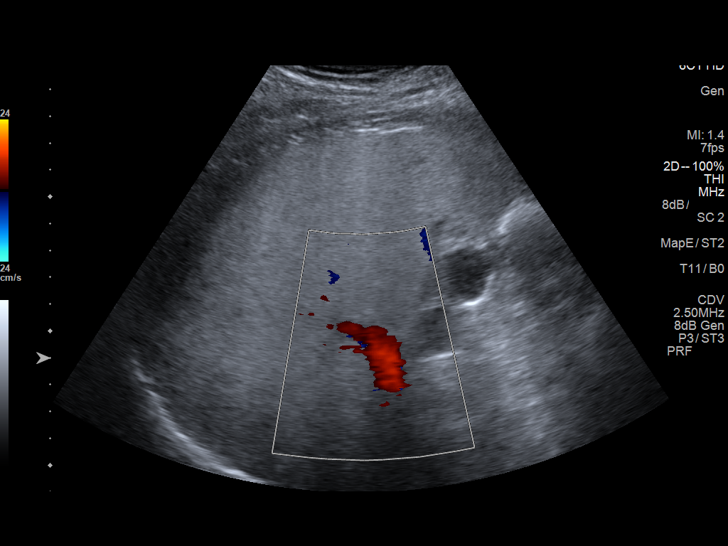

[14 of 25 positions shown; findings below may reference images not displayed]

FINDINGS: Gallbladder:

No gallstones or wall thickening visualized. No sonographic Murphy
sign noted by sonographer.

Common bile duct:

Diameter: 3.2 mm

Liver:

Diffusely echogenic.  Corresponding low density on the recent CT.
IMPRESSION: 1. No acute abnormality.
2. Diffuse hepatic steatosis.

## 2016-10-05 IMAGING — US US SCROTUM
1 series · 14 of 25 positions shown · non-contrast
Comparison: None.

CLINICAL DATA: 30-year-old male with testicular pain and swelling.

EXAM:
SCROTAL ULTRASOUND
DOPPLER ULTRASOUND OF THE TESTICLES
TECHNIQUE: Complete ultrasound examination of the testicles, epididymis, and
other scrotal structures was performed. Color and spectral Doppler
ultrasound were also utilized to evaluate blood flow to the
testicles.

[Series 1: us scrotum · 0.07mm/px · 48 acquisitions, 14 frames shown]
[im 1/48]
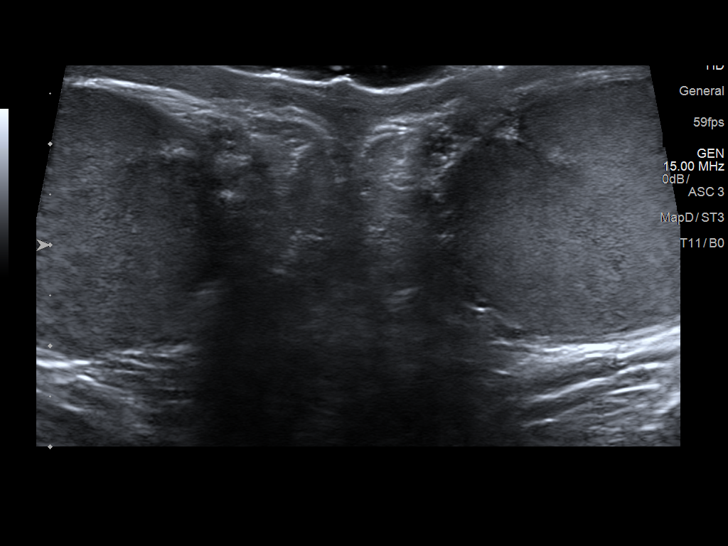
[im 4/48]
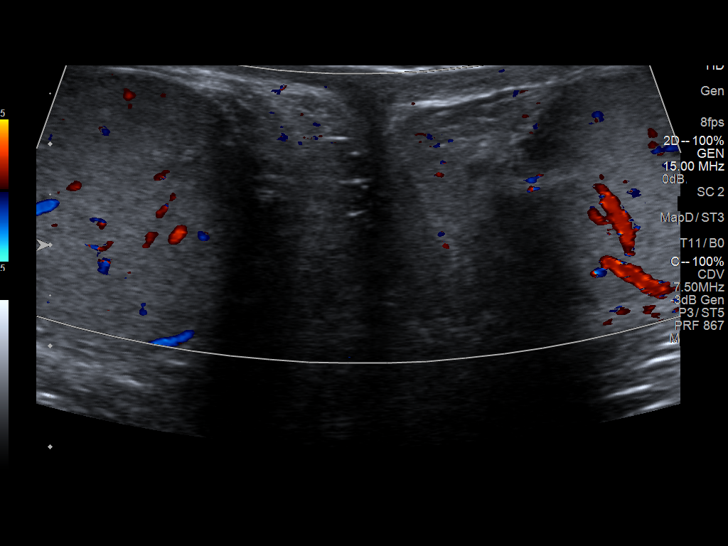
[im 8/48]
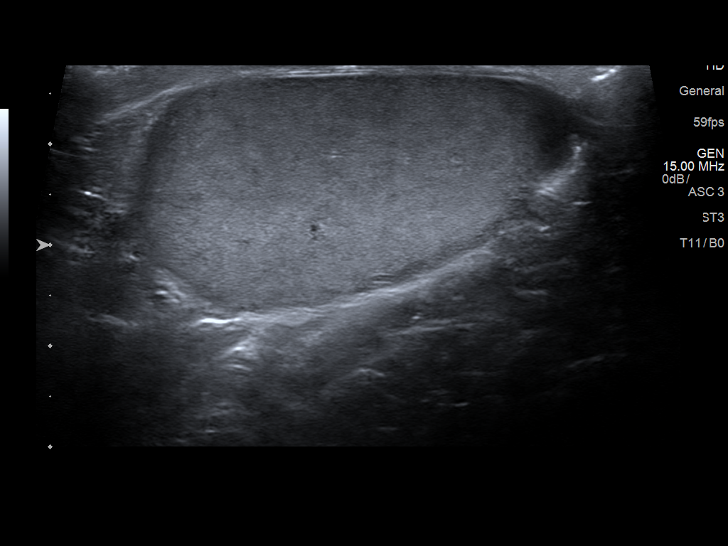
[im 12/48]
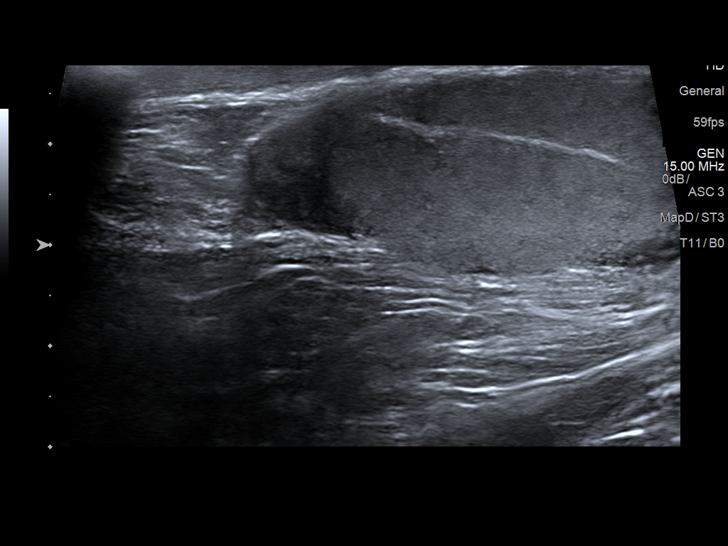
[im 16/48]
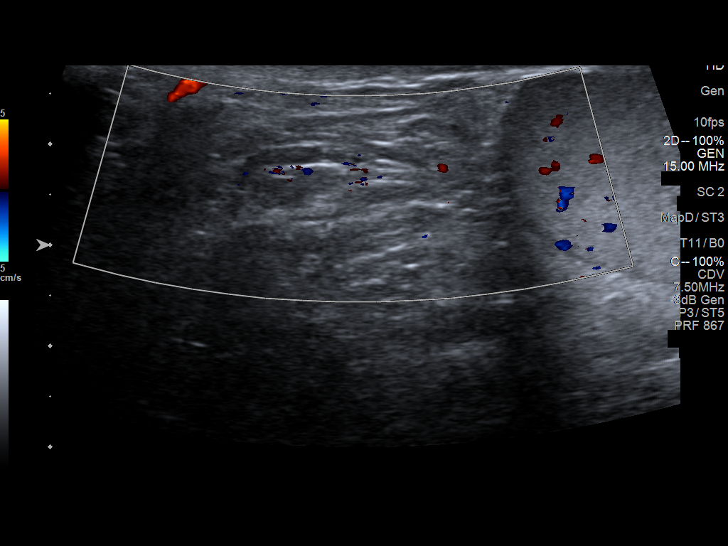
[im 18/48]
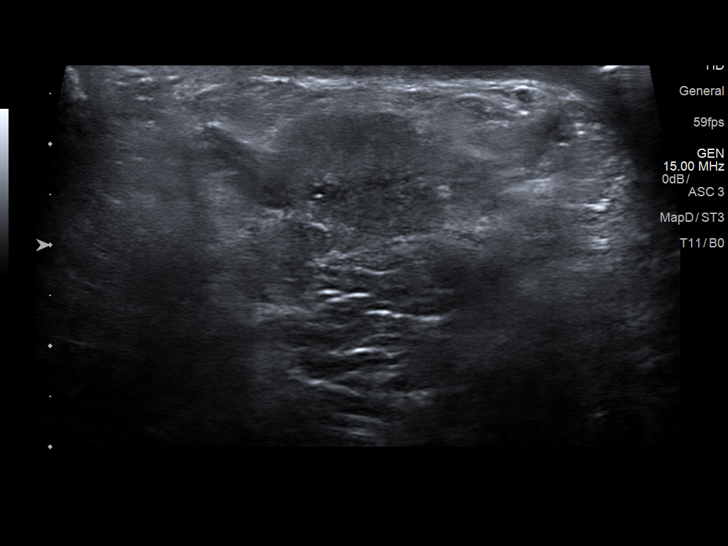
[im 22/48]
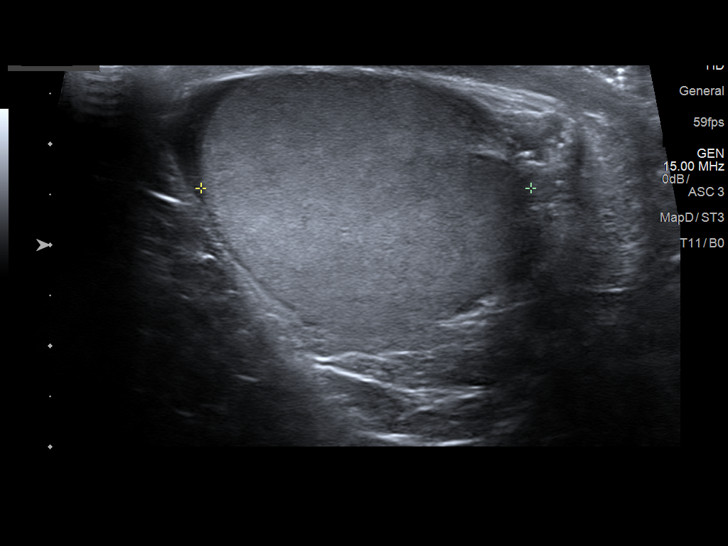
[im 26/48]
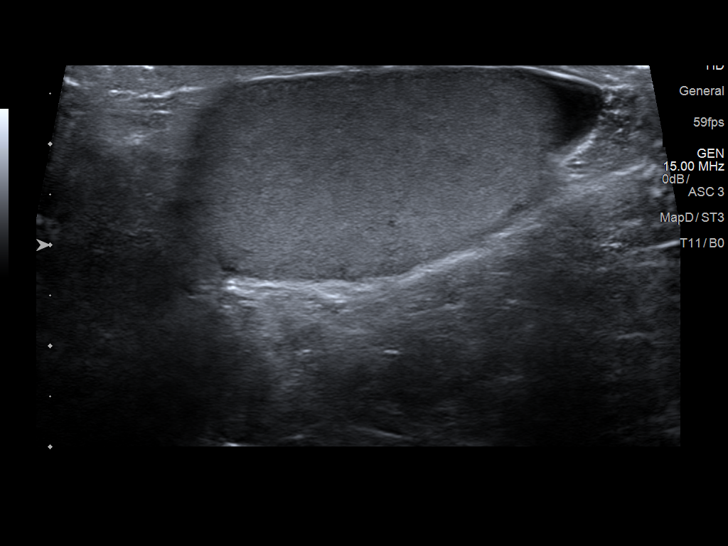
[im 30/48]
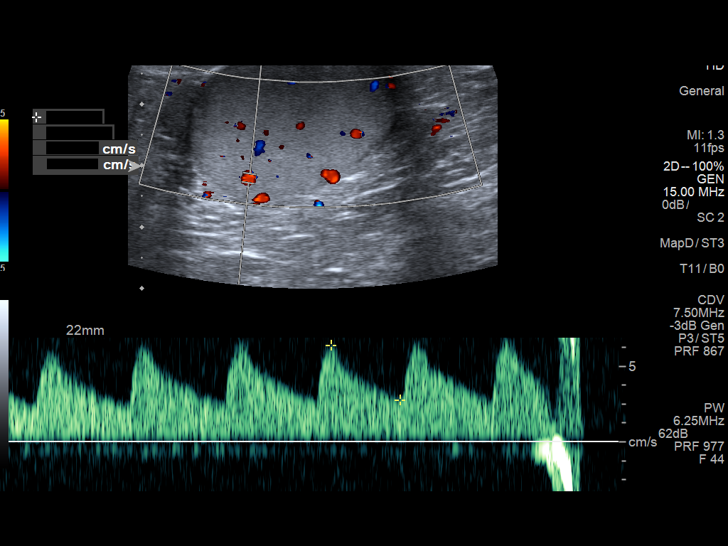
[im 32/48]
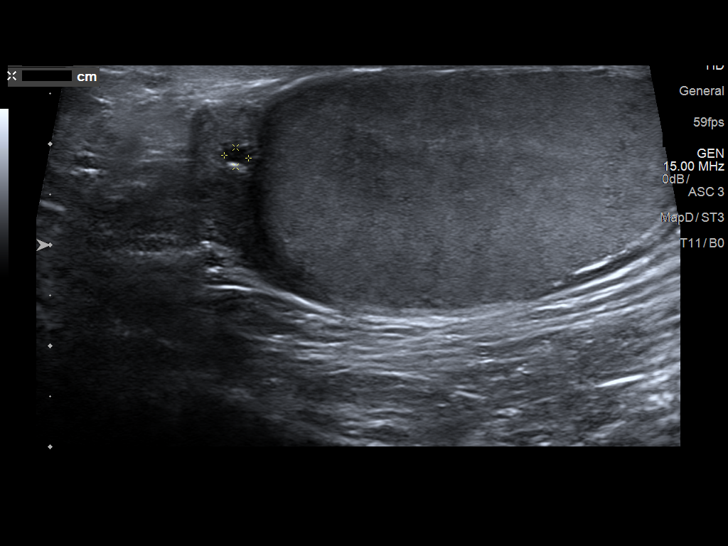
[im 36/48]
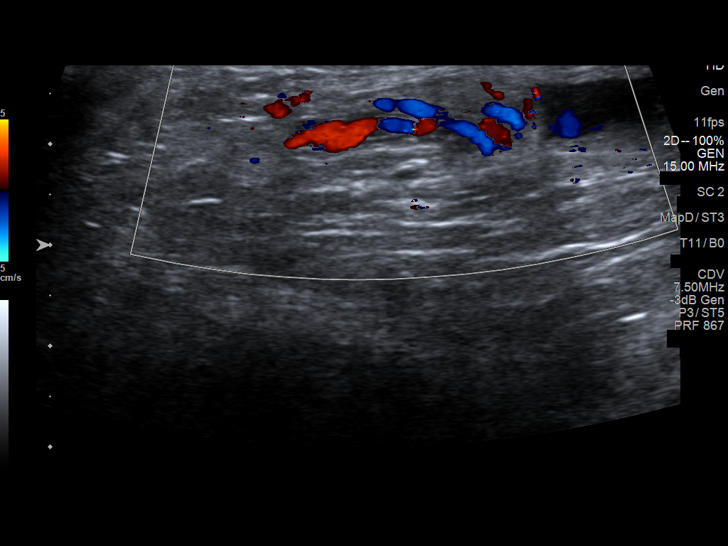
[im 40/48]
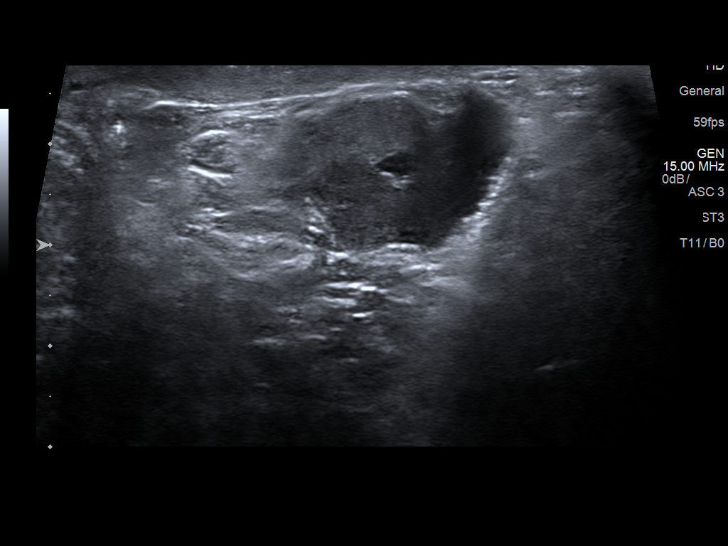
[im 44/48]
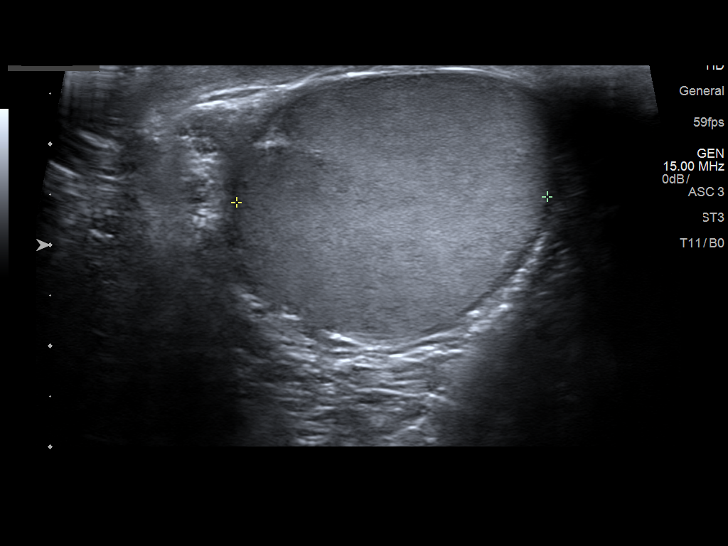
[im 48/48]
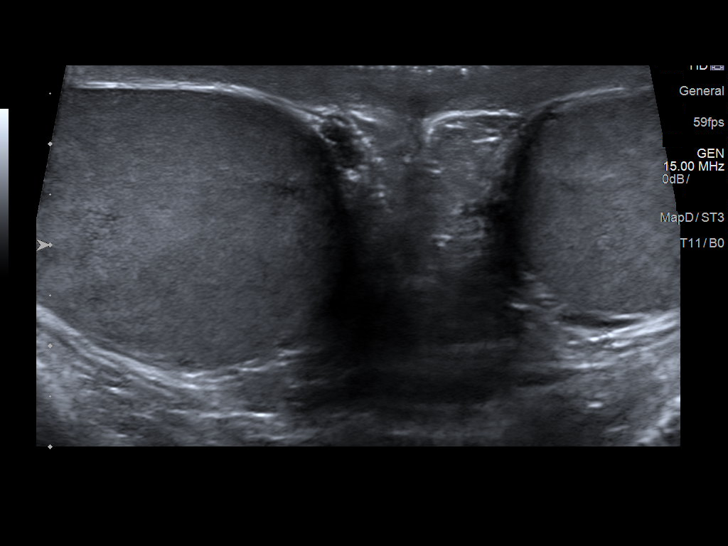

[14 of 25 positions shown; findings below may reference images not displayed]

FINDINGS: Right testicle

Measurements: 4.6 x 2.6 x 3.3 cm. No mass or microlithiasis
visualized.

Left testicle

Measurements: 4.5 x 2.5 x 3.1 cm. No mass or microlithiasis
visualized.

Right epididymis:  Normal in size and appearance.

Left epididymis: Normal in size and appearance. There is a 3 mm left
epididymal head cyst.

Hydrocele:  None visualized.

Varicocele:  Left-sided varicoceles noted.

Pulsed Doppler interrogation of both testes demonstrates normal low
resistance arterial and venous waveforms bilaterally.

There is mild edema of the scrotal sac.
IMPRESSION: Left-sided varicoceles otherwise unremarkable testicular ultrasound.

## 2017-02-20 DIAGNOSIS — S6991XA Unspecified injury of right wrist, hand and finger(s), initial encounter: Secondary | ICD-10-CM | POA: Diagnosis not present

## 2017-06-21 DIAGNOSIS — Z6830 Body mass index (BMI) 30.0-30.9, adult: Secondary | ICD-10-CM | POA: Diagnosis not present

## 2017-06-21 DIAGNOSIS — Z7189 Other specified counseling: Secondary | ICD-10-CM | POA: Diagnosis not present

## 2017-06-21 DIAGNOSIS — M25512 Pain in left shoulder: Secondary | ICD-10-CM | POA: Diagnosis not present

## 2018-05-06 IMAGING — CT CT ABD-PELV W/ CM
2 of 4 series · 15 of 46 positions shown, 17 images · IV contrast (iopamidol)
Comparison: CT scan of the abdomen dated 02/25/2016 performed at
[facility]

CLINICAL DATA: Generalized abdominal pain and left flank pain.

EXAM:
CT ABDOMEN AND PELVIS WITH CONTRAST
TECHNIQUE: Multidetector CT imaging of the abdomen and pelvis was performed
using the standard protocol following bolus administration of
intravenous contrast.
CONTRAST:  1 [id number] IOPAMIDOL ([id number]) INJECTION 61%

[Series 2: a/p w/ 5mm · axial · 0.98mm/px · z∈[+651,+1176]mm · 12 of 117 slices shown, 14 images]
[im 6/117  soft-tissue]
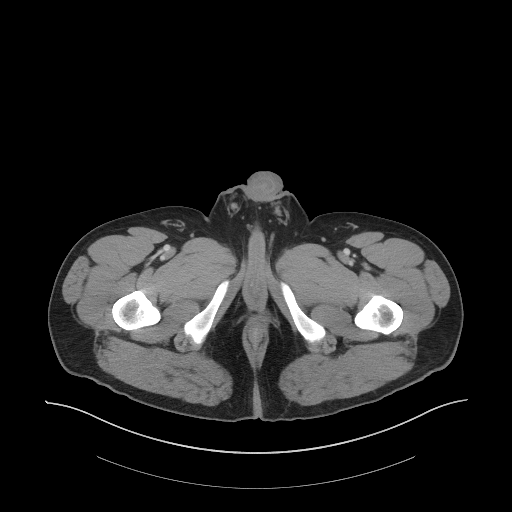
[im 6/117  bone]
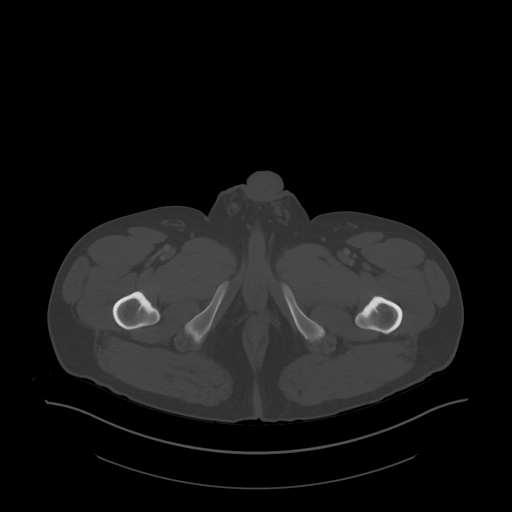
[im 16/117  soft-tissue]
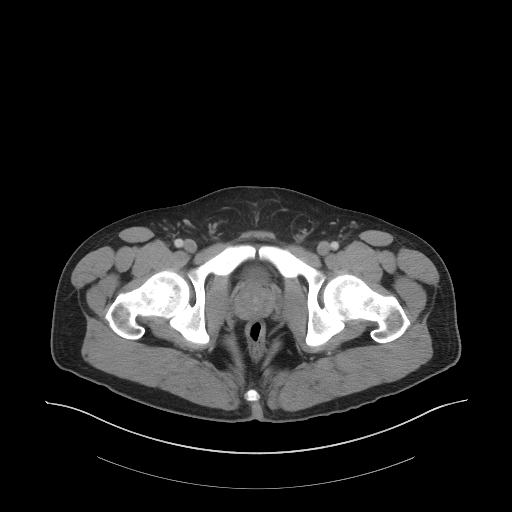
[im 26/117  soft-tissue]
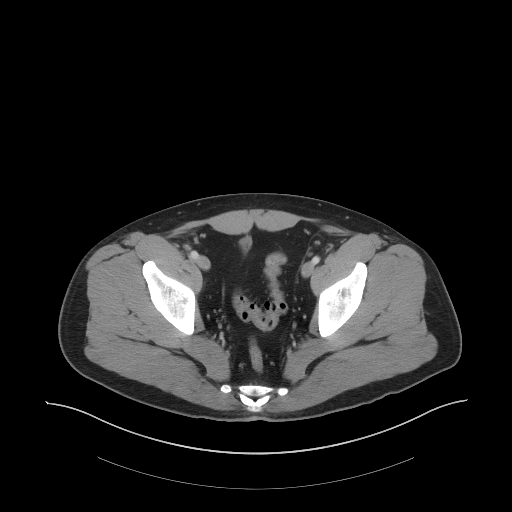
[im 36/117  soft-tissue]
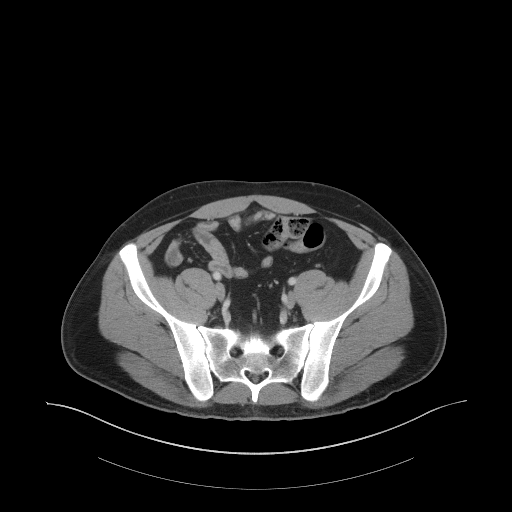
[im 46/117  soft-tissue]
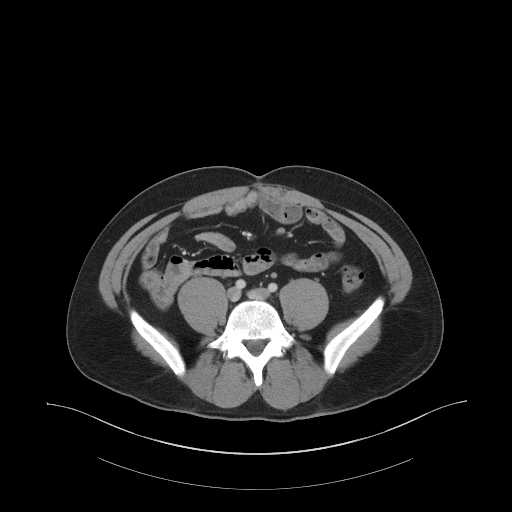
[im 56/117  soft-tissue]
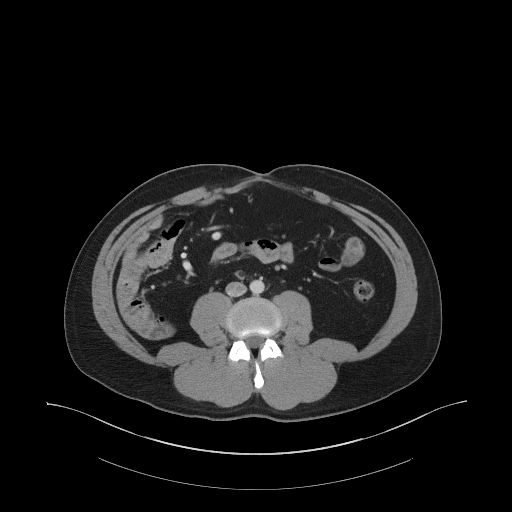
[im 61/117  soft-tissue]
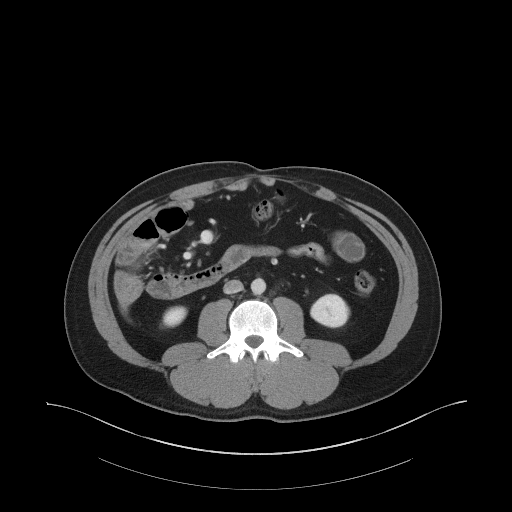
[im 71/117  soft-tissue]
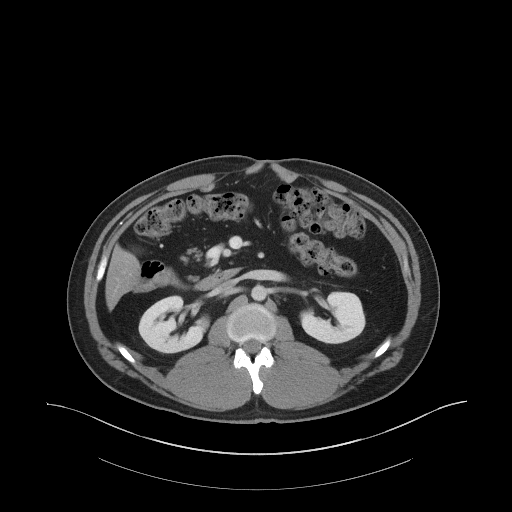
[im 81/117  soft-tissue]
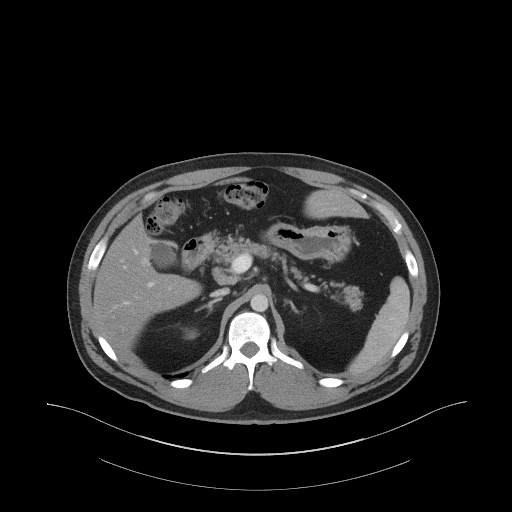
[im 81/117  bone]
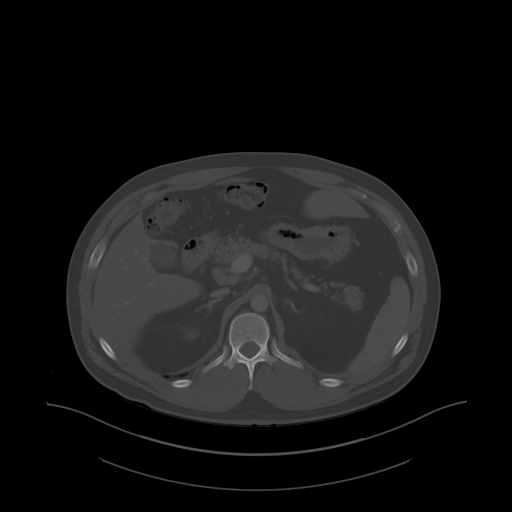
[im 91/117  soft-tissue]
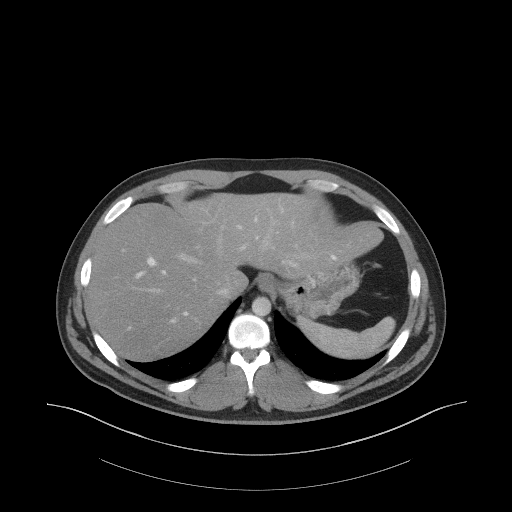
[im 101/117  soft-tissue]
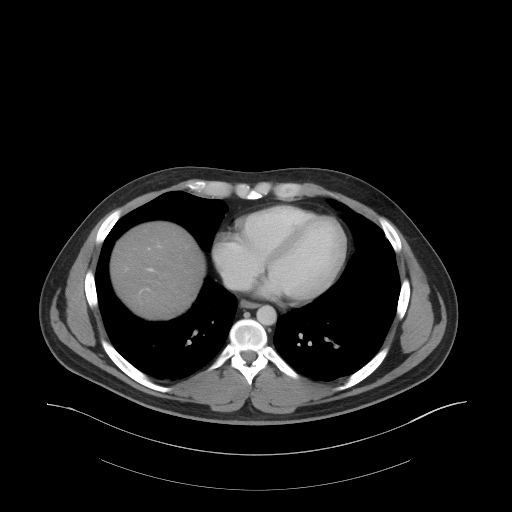
[im 111/117  soft-tissue]
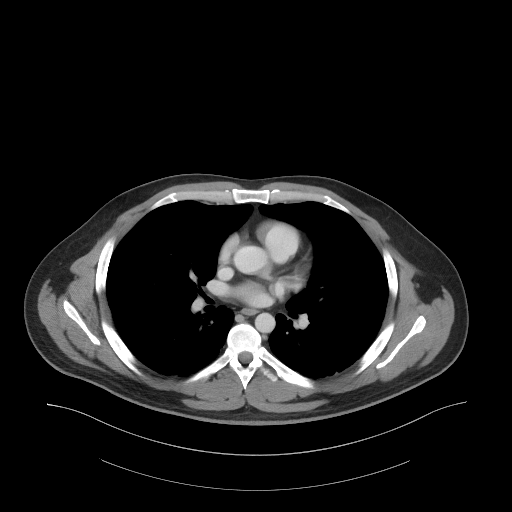

[Series 5: a/p w/ cor · coronal · 1.14mm/px · 3 of 151 slices shown]
[im 67/151  soft-tissue]
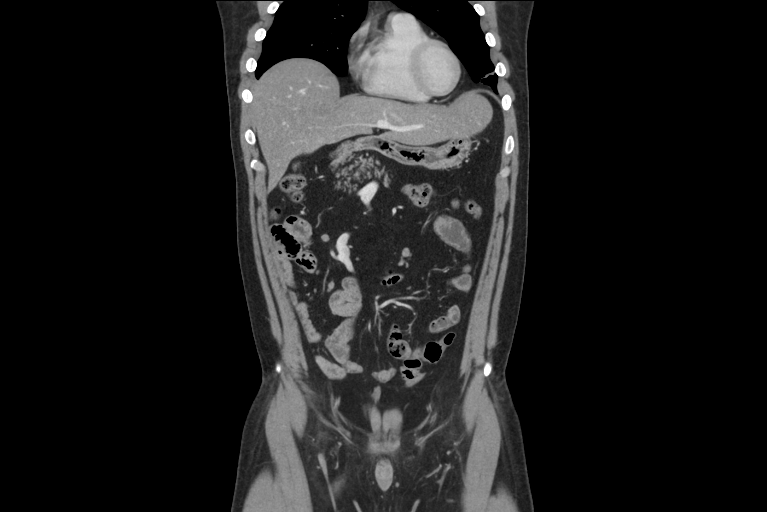
[im 84/151  soft-tissue]
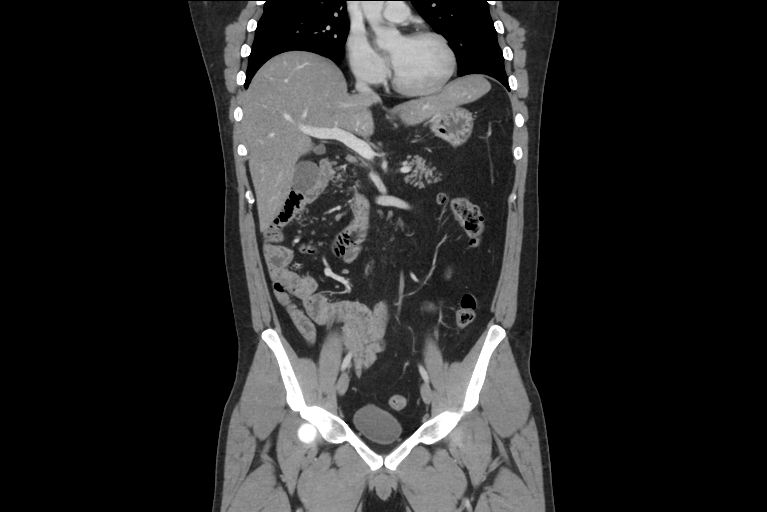
[im 101/151  soft-tissue]
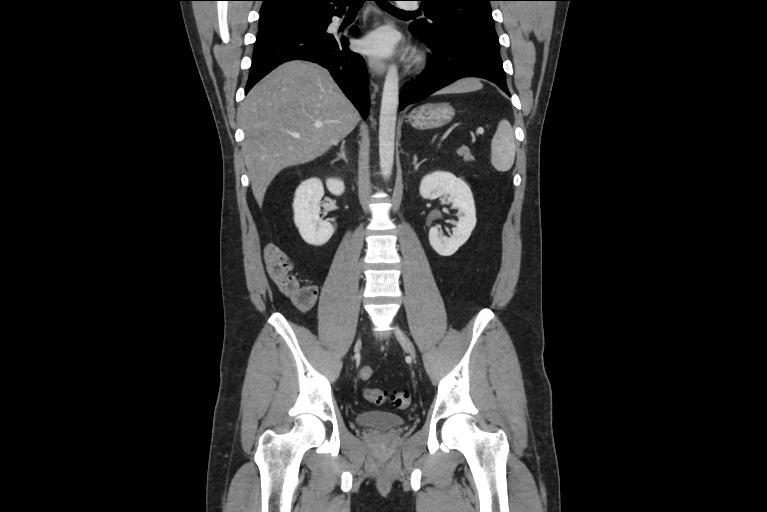

[15 of 46 positions shown; findings below may reference images not displayed]

FINDINGS: Lower chest: 4 mm nodule in the right lower lobe seen on image 9 of
series 3, unchanged since the prior exam. The heart size is normal.

Hepatobiliary: Diffuse hepatic steatosis. No focal lesions. Biliary
tree is normal.

Pancreas: Normal.

Spleen: Normal.

Adrenals/Urinary Tract: Normal.

Stomach/Bowel: No evidence of obstruction. The cecum lies in the
left upper quadrant and the appendix is in the left mid abdomen.
There is intense enhancement of the mucosa of the terminal ileum
there is no adjacent inflammation. The bowel otherwise appears
normal.

Vascular/Lymphatic: Normal.

Reproductive: Normal.

Other: No free air or free fluid.

Musculoskeletal:  Normal.
IMPRESSION: 1. Congenital anomaly of the cecum lying in the left upper quadrant
and mid abdomen. The appendix is in the left side of the abdomen.
2. Intense enhancement of the mucosa of the terminal ileum without
adjacent inflammation. I suspect this is normal. Does the patient
have any symptoms of inflammatory bowel disease?
3. Stable 4 mm nodule at the right lung base. No follow-up is
recommended if the patient is at low risk for lung cancer.
4. Diffuse hepatic steatosis.

## 2019-02-09 DIAGNOSIS — M25472 Effusion, left ankle: Secondary | ICD-10-CM | POA: Diagnosis not present

## 2019-02-09 DIAGNOSIS — S99912A Unspecified injury of left ankle, initial encounter: Secondary | ICD-10-CM | POA: Diagnosis not present

## 2019-02-09 DIAGNOSIS — M10072 Idiopathic gout, left ankle and foot: Secondary | ICD-10-CM | POA: Diagnosis not present

## 2019-02-09 DIAGNOSIS — M7989 Other specified soft tissue disorders: Secondary | ICD-10-CM | POA: Diagnosis not present

## 2019-02-09 NOTE — ED Provider Notes (Signed)
 ------------------------------------------------------------------------------- Attestation signed by Keith Zachary Sages, DO at 02/09/19 1703 I was the supervising physician in the delivery of the service.  I have reviewed the H&P and agree with the assessment and plan in the note.  -------------------------------------------------------------------------------  Emergency Department Provider Note    ED Clinical Impression   Final diagnoses:  Acute left ankle pain (Primary)  Acute gout of left ankle, unspecified cause    ED Assessment/Plan   Keith Garcia is a 33 y.o. male  with a PMH of gout (self reported) who presents to the ED for 1 week of left sided ankle pain. On exam, pt is well appearing and in NAD. VS were unremarkable. Exam revealed edematous left ankle without overlying erythema or warmth, decreased ROM 2/2 pain. Increased tenderness to posterior aspect of ankle. Achilles intact, able to wiggle toes, neurovascularly intact. Ddx includes, but not limited to: fracture, sprain, gout. Less concern for septic joint or infection given exam and reassuring VS. Plan XR L ankle.  ED Course as of Feb 09 1599  Sat Feb 09, 2019  1410 IMPRESSION: 1. No acute fracture or dislocation identified about the left ankle. 2. Moderate joint effusion.  XR Ankle 3 Or More Views Left   Results reviewed with patient. Given pain in his first toe pain and similar symptoms with gout in the past, gout is suspected. Also discussed possibility of sprain. Rx given for naproxen as patient declined prednisone. ACE bandage provided for comfort. Patient declined crutches. Symptomatic care, including RICE and low purine diet discussed. F/u with PCP within one week. Return precautions reviewed. Patient is in agreement with plan.  Case and plan discussed with Dr. Junita.   History   Chief Complaint  Patient presents with  . Ankle Pain   HPI  Keith Garcia is a 33 y.o. male with a PMH of gout (self  reported) who presents to the ED for ankle pain. The pt reports 1 week of non-traumatic left ankle swelling with associated 10/10 pain. Pt denies any known injury. Pt states he woke last Friday (02/01/2019) and his L ankle was sore. Saturday it was more sore so he stayed off of it. He states Sunday it was so painful he could not bear weight. Pt states he stayed off the ankle for the remainder of the week to try and let it heal. He states it was feeling better (not yet resolved) yesterday and helped his friend work on a truck. When he got home and it was sore, so he iced it. The pain progressed and he was unable to sleep last night 2/2 pain. Pt states while his L great toe was not hurting earlier in the week, it started hurting yesterday. Pt feels like his ankle and great toe feel as though they need to be popped. The pt reports taking BC powder without relief. Pt reports a hx of gout (2016) in his toe, for which he is taking an OTC remedy (all natural cherry extract). He states his last bout of gout was 2018, but he states he took the cherry extract right away and fixed the toe before it got bad again. He states his previous gout episodes have been in his left great toe. His current episode feels similar, but with edema in his ankle.  Denies fever, cough, rhinorrhea, sore throat, chest pain, shortness of breath, known COVID contact. Endorses headache (from lack of sleep).  No past medical history on file.  Past Surgical History:  Procedure Laterality Date  . TYMPANOSTOMY  TUBE PLACEMENT      History reviewed. No pertinent family history.  Social History   Socioeconomic History  . Marital status: Single    Spouse name: None  . Number of children: None  . Years of education: None  . Highest education level: None  Occupational History  . None  Social Needs  . Financial resource strain: None  . Food insecurity    Worry: None    Inability: None  . Transportation needs    Medical: None     Non-medical: None  Tobacco Use  . Smoking status: Never Smoker  . Smokeless tobacco: Never Used  Substance and Sexual Activity  . Alcohol use: Yes    Comment: once in awhile  . Drug use: Yes    Types: Marijuana  . Sexual activity: None  Lifestyle  . Physical activity    Days per week: None    Minutes per session: None  . Stress: None  Relationships  . Social Musician on phone: None    Gets together: None    Attends religious service: None    Active member of club or organization: None    Attends meetings of clubs or organizations: None    Relationship status: None  Other Topics Concern  . None  Social History Narrative  . None    Review of Systems  Constitutional: Negative for chills and fever.  HENT: Negative for congestion, rhinorrhea and sore throat.   Respiratory: Negative for cough and shortness of breath.   Cardiovascular: Negative for chest pain.  Gastrointestinal: Negative for abdominal pain, diarrhea, nausea and vomiting.  Genitourinary: Negative.   Musculoskeletal: Positive for arthralgias. Negative for back pain.       Positive for ankle pain  Skin: Negative for rash.  Neurological: Positive for headaches. Negative for weakness and numbness.  All other systems reviewed and are negative.   Physical Exam   BP 117/71   Pulse 72   Temp 36.3 C (97.4 F) (Temporal)   Resp 18   Ht 181.6 cm (5' 11.5)   Wt 93 kg (205 lb)   SpO2 97%   BMI 28.19 kg/m   Physical Exam Vitals signs and nursing note reviewed.  Constitutional:      General: He is not in acute distress.    Appearance: Normal appearance. He is well-developed. He is not ill-appearing, toxic-appearing or diaphoretic.  HENT:     Head: Normocephalic and atraumatic.     Ears:     Comments: Hearing aids noted Eyes:     General: No scleral icterus.    Conjunctiva/sclera: Conjunctivae normal.  Neck:     Musculoskeletal: Normal range of motion and neck supple.  Cardiovascular:      Rate and Rhythm: Normal rate and regular rhythm.     Pulses: Normal pulses.     Heart sounds: Normal heart sounds. No murmur.  Pulmonary:     Effort: Pulmonary effort is normal. No respiratory distress.     Breath sounds: Normal breath sounds. No wheezing, rhonchi or rales.  Musculoskeletal:     Left ankle: He exhibits decreased range of motion and swelling. Tenderness. Achilles tendon exhibits pain.     Comments: edematous left ankle without overlying erythema or warmth, decreased ROM 2/2 pain. Increased tenderness to posterior aspect of ankle. Achilles intact, able to wiggle toes. No proximal fibular or 5th metatarsal tenderness. No palpable cord or calf tenderness.  Skin:    General: Skin is warm and dry.  Findings: No rash.  Neurological:     Mental Status: He is alert and oriented to person, place, and time.     Coordination: Coordination normal.     Comments: Sensation intact to bilateral feet. Patient is able to bear weight.  Psychiatric:        Behavior: Behavior normal.        Thought Content: Thought content normal.        Judgment: Judgment normal.     ED Course      MDM Reviewed: vitals, nursing note and previous chart Interpretation: x-ray     Documentation assistance was provided by Marsa Kenning, Scribe, on February 09, 2019 1:37 PM  for Laneta Abu, PA-c.   February 09, 2019 3:58 PM. Documentation assistance provided by the scribe. I was present during the time the encounter was recorded. The information recorded by the scribe was done at my direction and has been reviewed and validated by me.      Natalie Danielle Ford, [place] 02/09/19 1600

## 2019-09-12 DIAGNOSIS — Z20828 Contact with and (suspected) exposure to other viral communicable diseases: Secondary | ICD-10-CM | POA: Diagnosis not present

## 2024-02-26 ENCOUNTER — Emergency Department (HOSPITAL_COMMUNITY)

## 2024-02-26 ENCOUNTER — Other Ambulatory Visit: Payer: Self-pay

## 2024-02-26 ENCOUNTER — Emergency Department (HOSPITAL_COMMUNITY)
Admission: EM | Admit: 2024-02-26 | Discharge: 2024-02-26 | Disposition: A | Discharge status: Home / Self Care | Attending: Emergency Medicine | Admitting: Emergency Medicine

## 2024-02-26 ENCOUNTER — Encounter (HOSPITAL_COMMUNITY): Payer: Self-pay

## 2024-02-26 DIAGNOSIS — M542 Cervicalgia: Secondary | ICD-10-CM | POA: Diagnosis not present

## 2024-02-26 DIAGNOSIS — R519 Headache, unspecified: Secondary | ICD-10-CM | POA: Diagnosis present

## 2024-02-26 LAB — I-STAT CHEM 8, ED
BUN: 21 mg/dL — ABNORMAL HIGH (ref 6–20)
Calcium, Ion: 0.89 mmol/L — CL (ref 1.15–1.40)
Chloride: 108 mmol/L (ref 98–111)
Creatinine, Ser: 1 mg/dL (ref 0.61–1.24)
Glucose, Bld: 89 mg/dL (ref 70–99)
HCT: 51 % (ref 39.0–52.0)
Hemoglobin: 17.3 g/dL — ABNORMAL HIGH (ref 13.0–17.0)
Potassium: 5.7 mmol/L — ABNORMAL HIGH (ref 3.5–5.1)
Sodium: 135 mmol/L (ref 135–145)
TCO2: 27 mmol/L (ref 22–32)

## 2024-02-26 MED ORDER — IOHEXOL 350 MG/ML SOLN
75.0000 mL | Freq: Once | INTRAVENOUS | Status: AC | PRN
  Administered 2024-02-26: 75 mL via INTRAVENOUS

## 2024-02-26 MED ORDER — KETOROLAC TROMETHAMINE 30 MG/ML IJ SOLN
30.0000 mg | Freq: Once | INTRAMUSCULAR | Status: AC
  Administered 2024-02-26: 30 mg via INTRAVENOUS
  Filled 2024-02-26: qty 1

## 2024-02-26 MED ORDER — LACTATED RINGERS IV BOLUS
500.0000 mL | Freq: Once | INTRAVENOUS | Status: AC
  Administered 2024-02-26: 500 mL via INTRAVENOUS

## 2024-02-26 MED ORDER — METHYLPREDNISOLONE SODIUM SUCC 125 MG IJ SOLR
125.0000 mg | Freq: Once | INTRAMUSCULAR | Status: AC
  Administered 2024-02-26: 125 mg via INTRAVENOUS
  Filled 2024-02-26: qty 2

## 2024-02-26 MED ORDER — PROCHLORPERAZINE EDISYLATE 10 MG/2ML IJ SOLN
10.0000 mg | INTRAMUSCULAR | Status: AC
  Administered 2024-02-26: 10 mg via INTRAVENOUS
  Filled 2024-02-26: qty 2

## 2024-02-26 NOTE — Discharge Instructions (Addendum)
 You were evaluated in the emergency department with a CT of your head and a CT angiogram of your head.  There appears to be no abnormalities.  Your symptoms improved with migraine headache medications of Solu-Medrol , Toradol , and Compazine .  You may continue to use acetaminophen  ibuprofen and caffeine as needed over-the-counter for headache.  Please return if you are having any new or worsening symptoms and follow-up with your doctor

## 2024-02-26 NOTE — ED Triage Notes (Signed)
 Pt came in via POV d/t a migraine the last 2 days that if he has any type of strain like a sneeze or cough (per pt) he will feel a sharp pain on the Rt side of his head causing his Rt ear & teeth to hurt for the next 20 minutes. Has been feeling very nauseous & has been very sensitive to lights as well. A/Ox4, rates his pain 6/10, but the occasional sharp pain is a 9/10.

## 2024-02-26 NOTE — ED Provider Notes (Signed)
 [place] [facility] AT [facility] Provider Note   CSN: [id number] Arrival date & time: 02/26/24  9262     Patient presents with: Migraine   Keith Garcia is a 38 y.o. male.   HPI   38 year old male presents today complaining of headache that began Saturday.  He states that it began during intercourse.  He has had some migraine headaches in the past but states this is different from prior ones.  He states that hurts all the way to the back of his neck.  It was somewhat better when he woke up on Sunday morning but proceeded to get worse throughout the day.  He denies any lateralized weakness, numbness, tingling, difficulty speaking or seeing.  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  He felt like the pain radiated into his ear and his teeth but has no known dental problems.  He initially used ibuprofen without significant relief.  He is not currently taking any medications.  Prior to Admission medications   Medication Sig Start Date End Date Taking? Authorizing Provider  oxyCODONE -acetaminophen  (PERCOCET) 5-325 MG tablet Take 1 tablet by mouth every 4 (four) hours as needed for moderate pain. 03/03/16   Raford Lenis, MD    Allergies: Patient has no known allergies.    Review of Systems  Updated Vital Signs BP 102/80   Pulse (!) 58   Temp 98 F (36.7 C)   Resp 16   SpO2 99%   Physical Exam Vitals reviewed.  HENT:     Head: Normocephalic.     Right Ear: Tympanic membrane and external ear normal.     Left Ear: Tympanic membrane and external ear normal.     Nose: Nose normal.     Mouth/Throat:     Pharynx: Oropharynx is clear.  Eyes:     Extraocular Movements: Extraocular movements intact.     Pupils: Pupils are equal, round, and reactive to light.  Cardiovascular:     Rate and Rhythm: Normal rate and regular rhythm.     Pulses: Normal pulses.  Pulmonary:     Effort: Pulmonary effort is normal.  Abdominal:     Palpations: Abdomen is soft.   Musculoskeletal:        General: Normal range of motion.     Cervical back: Normal range of motion.  Skin:    General: Skin is warm.     Capillary Refill: Capillary refill takes less than 2 seconds.  Neurological:     General: No focal deficit present.     Mental Status: He is alert.  Psychiatric:        Mood and Affect: Mood normal.        Behavior: Behavior normal.     (all labs ordered are listed, but only abnormal results are displayed) Labs Reviewed  I-STAT CHEM 8, ED - Abnormal; Notable for the following components:      Result Value   Potassium 5.7 (*)    BUN 21 (*)    Calcium, Ion 0.89 (*)    Hemoglobin 17.3 (*)    All other components within normal limits    EKG: None  Radiology: CT ANGIO HEAD NECK W WO CM Result Date: 02/26/2024 CLINICAL DATA:  Headache, new onset. Migraine for the past 2 days worsening with sees, cough, or any other strain. Pain on right side of head with right ear and right dental pain. EXAM: CT ANGIOGRAPHY HEAD AND NECK WITH AND WITHOUT CONTRAST TECHNIQUE: Multidetector CT imaging of the  head and neck was performed using the standard protocol during bolus administration of intravenous contrast. Multiplanar CT image reconstructions and MIPs were obtained to evaluate the vascular anatomy. Carotid stenosis measurements (when applicable) are obtained utilizing NASCET criteria, using the distal internal carotid diameter as the denominator. RADIATION DOSE REDUCTION: This exam was performed according to the departmental dose-optimization program which includes automated exposure control, adjustment of the mA and/or kV according to patient size and/or use of iterative reconstruction technique. CONTRAST:  75mL OMNIPAQUE  IOHEXOL  350 MG/ML SOLN COMPARISON:  Same-day head CT. FINDINGS: CTA NECK FINDINGS Aortic arch: Standard configuration of the aortic arch. Imaged portion shows no evidence of aneurysm or dissection. No significant stenosis of the major arch vessel  origins. Pulmonary arteries: As permitted by contrast timing, there are no filling defects in the visualized pulmonary arteries. Subclavian arteries: The left subclavian artery is patent. Limited evaluation of the right subclavian artery due to adjacent dense venous contrast and streak artifact. Right carotid system: No evidence of dissection, stenosis (50% or greater), or occlusion. Left carotid system: No evidence of dissection, stenosis (50% or greater), or occlusion. Vertebral arteries: Right vertebral artery is dominant. No evidence of dissection, stenosis (50% or greater), or occlusion. Skeleton: No acute or aggressive finding noted. Other neck: The visualized airway is patent. No cervical lymphadenopathy. Upper chest: Visualized lung apices are clear. Review of the MIP images confirms the above findings CTA HEAD FINDINGS ANTERIOR CIRCULATION: The intracranial internal carotid arteries are patent. No significant atherosclerosis or high-grade stenosis. MCAs: The middle cerebral arteries are patent bilaterally. ACAs: The anterior cerebral arteries are patent bilaterally. POSTERIOR CIRCULATION: No significant stenosis, proximal occlusion, aneurysm, or vascular malformation. PCAs: The posterior cerebral arteries are patent bilaterally. Pcomm: Not well visualized. SCAs: The superior cerebellar arteries are patent bilaterally. Basilar artery: Patent AICAs: Not well visualized. PICAs: Patent Vertebral arteries: The intracranial vertebral arteries are patent. Venous sinuses: As permitted by contrast timing, patent. Anatomic variants: None Review of the MIP images confirms the above findings IMPRESSION: Negative CTA head and neck. Electronically Signed   By: Donnice Mania M.D.   On: 02/26/2024 12:51   CT Head Wo Contrast Result Date: 02/26/2024 CLINICAL DATA:  Sudden onset headache. EXAM: CT HEAD WITHOUT CONTRAST TECHNIQUE: Contiguous axial images were obtained from the base of the skull through the vertex without  intravenous contrast. RADIATION DOSE REDUCTION: This exam was performed according to the departmental dose-optimization program which includes automated exposure control, adjustment of the mA and/or kV according to patient size and/or use of iterative reconstruction technique. COMPARISON:  None Available. FINDINGS: Brain: There is no evidence for acute hemorrhage, hydrocephalus, mass lesion, or abnormal extra-axial fluid collection. No definite CT evidence for acute infarction. Vascular: No hyperdense vessel or unexpected calcification. Skull: No evidence for fracture. No worrisome lytic or sclerotic lesion. Sinuses/Orbits: The visualized paranasal sinuses and mastoid air cells are clear. Visualized portions of the globes and intraorbital fat are unremarkable. Other: None. IMPRESSION: No acute intracranial abnormality. Electronically Signed   By: Camellia Candle M.D.   On: 02/26/2024 09:08     Procedures   Medications Ordered in the ED  lactated ringers  bolus 500 mL (0 mLs Intravenous Stopped 02/26/24 1332)  prochlorperazine  (COMPAZINE ) injection 10 mg (10 mg Intravenous Given 02/26/24 1117)  ketorolac  (TORADOL ) 30 MG/ML injection 30 mg (30 mg Intravenous Given 02/26/24 1117)  methylPREDNISolone  sodium succinate (SOLU-MEDROL ) 125 mg/2 mL injection 125 mg (125 mg Intravenous Given 02/26/24 1117)  iohexol  (OMNIPAQUE ) 350 MG/ML injection 75 mL (  75 mLs Intravenous Contrast Given 02/26/24 1224)    Clinical Course as of 02/26/24 1410  Mon Feb 26, 2024  9073 CT head obtained that shows no evidence of acute abnormality [DR]  1254 CTA head and neck with no acute abnormalities [DR]    Clinical Course User Index [DR] Levander Houston, MD                                 Medical Decision Making Amount and/or Complexity of Data Reviewed Radiology: ordered.  Risk Prescription drug management.   38 year old male with headache that began on Saturday.  Discerning features include began with intercourse, severe at  beginning.  Some neck pain lateralized to the right side of the head.  CT head here is negative.  Discussed risk factors with patient.  Plan CTA.  Obtaining i-STAT and giving Compazine , Toradol , and Solu-Medrol .  Discussed risks and benefits of LP.  Discussion with patient will treat symptoms and if proved will discharge if head CTA is negative. CTA head negative.  Patient's symptoms improved.  Patient appears stable for discharge to home    Final diagnoses:  Bad headache    ED Discharge Orders     None          Levander Houston, MD 02/26/24 1410

## 2024-02-26 NOTE — ED Notes (Signed)
 Patient transported to CT
